# Patient Record
Sex: Female | Born: 1960 | Race: White | Hispanic: No | Marital: Married
Health system: Southern US, Community
[De-identification: ages and names within clinical notes are randomized; demographics above are authoritative.]

## PROBLEM LIST (undated history)

## (undated) DIAGNOSIS — E785 Hyperlipidemia, unspecified: Secondary | ICD-10-CM

## (undated) DIAGNOSIS — I10 Essential (primary) hypertension: Secondary | ICD-10-CM

## (undated) DIAGNOSIS — G4733 Obstructive sleep apnea (adult) (pediatric): Secondary | ICD-10-CM

## (undated) HISTORY — DX: Obstructive sleep apnea (adult) (pediatric): G47.33

## (undated) HISTORY — DX: Essential (primary) hypertension: I10

## (undated) HISTORY — DX: Hyperlipidemia, unspecified: E78.5

## (undated) HISTORY — PX: EXTERNAL EAR SURGERY: SHX627

---

## 2004-08-19 ENCOUNTER — Ambulatory Visit: Payer: Self-pay | Admitting: Internal Medicine

## 2004-12-26 ENCOUNTER — Ambulatory Visit: Payer: Self-pay | Admitting: Internal Medicine

## 2005-10-06 ENCOUNTER — Ambulatory Visit: Payer: Self-pay | Admitting: Internal Medicine

## 2006-10-14 ENCOUNTER — Ambulatory Visit: Payer: Self-pay | Admitting: Internal Medicine

## 2007-11-29 ENCOUNTER — Ambulatory Visit: Payer: Self-pay | Admitting: Internal Medicine

## 2008-06-26 ENCOUNTER — Emergency Department: Payer: Self-pay | Admitting: Emergency Medicine

## 2008-06-27 ENCOUNTER — Ambulatory Visit: Payer: Self-pay | Admitting: Internal Medicine

## 2008-10-03 ENCOUNTER — Ambulatory Visit: Payer: Self-pay | Admitting: Internal Medicine

## 2008-10-04 ENCOUNTER — Inpatient Hospital Stay: Payer: Self-pay | Admitting: Internal Medicine

## 2008-10-04 ENCOUNTER — Ambulatory Visit: Payer: Self-pay | Admitting: Internal Medicine

## 2008-10-10 ENCOUNTER — Ambulatory Visit: Payer: Self-pay | Admitting: Internal Medicine

## 2008-10-10 ENCOUNTER — Ambulatory Visit (HOSPITAL_COMMUNITY): Admission: RE | Admit: 2008-10-10 | Discharge: 2008-10-11 | Payer: Self-pay | Admitting: Internal Medicine

## 2008-10-11 DIAGNOSIS — R0989 Other specified symptoms and signs involving the circulatory and respiratory systems: Secondary | ICD-10-CM

## 2008-10-11 DIAGNOSIS — R0609 Other forms of dyspnea: Secondary | ICD-10-CM

## 2008-10-11 DIAGNOSIS — I498 Other specified cardiac arrhythmias: Secondary | ICD-10-CM

## 2008-10-11 DIAGNOSIS — I10 Essential (primary) hypertension: Secondary | ICD-10-CM

## 2008-10-12 ENCOUNTER — Telehealth: Payer: Self-pay | Admitting: Internal Medicine

## 2008-11-12 ENCOUNTER — Encounter: Payer: Self-pay | Admitting: Internal Medicine

## 2008-11-12 ENCOUNTER — Ambulatory Visit: Payer: Self-pay | Admitting: Internal Medicine

## 2008-11-15 ENCOUNTER — Telehealth: Payer: Self-pay | Admitting: Internal Medicine

## 2008-11-20 ENCOUNTER — Telehealth: Payer: Self-pay | Admitting: Internal Medicine

## 2008-12-03 ENCOUNTER — Ambulatory Visit: Payer: Self-pay | Admitting: Internal Medicine

## 2009-12-17 ENCOUNTER — Ambulatory Visit: Payer: Self-pay | Admitting: Internal Medicine

## 2010-09-16 NOTE — Discharge Summary (Signed)
NAME:  COLE, KLUGH                ACCOUNT NO.:  [id number]   MEDICAL RECORD NO.:  [id number]          PATIENT TYPE:  OIB   LOCATION:  2029                         FACILITY:  [facility]   PHYSICIAN:  Duke Salvia, MD, FACCDATE OF BIRTH:  06/01/1960   DATE OF ADMISSION:  10/10/2008  DATE OF DISCHARGE:  10/11/2008                               DISCHARGE SUMMARY   ADDENDUM:   MEDICATION CHANGES:  Added this admission magnesium oxide 400 mg daily  for a magnesium of 1.7.  Discontinue with triamterene,  hydrochlorothiazide 37.5/25.  She is to start hydrochlorothiazide 25 mg  daily; to start potassium chloride 10 mEq daily; to start Protonix 40 mg  daily; to start aspirin 325 mg daily for 6 weeks, and she can use her  other medications, which include calcium, Flonase, Zocor, Zyrtec,  trazodone, Zantac, colchicine.  Once again, followup visit with Dr.  Graciela Husbands on Monday, November 12, 2008 at 11:30.      Maple Mirza, [place]      Duke Salvia, MD, [facility]  Electronically Signed    GM/MEDQ  D:  10/11/2008  T:  10/12/2008  Job:  [id number]   cc:   Daniel Nones, MD  Duke Salvia, MD, [facility]  Dr. Juliann Pares

## 2010-09-16 NOTE — Discharge Summary (Signed)
NAME:  Kellie [name], Kellie [name]                ACCOUNT NO.:  [id number]   MEDICAL RECORD NO.:  [id number]          PATIENT TYPE:  INP   LOCATION:  2029                         FACILITY:  [facility]   PHYSICIAN:  Duke Salvia, MD, FACCDATE OF BIRTH:  Feb 10, 1961   DATE OF ADMISSION:  10/10/2008  DATE OF DISCHARGE:  10/11/2008                               DISCHARGE SUMMARY   ALLERGIES:  She has allergies to [facility] and NIACIN.   FINAL DIAGNOSES:  1. 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  2. Symptomatic supraventricular tachycardia.      a.     Symptoms of palpitation, dyspnea, and chest tightness.      b.     The patient has required adenosine to terminate both of her       previous episodes.   SECONDARY DIAGNOSES:  1. Echocardiogram on October 04, 2008, ejection fraction greater than 55%.  2. Previous Myoview study was negative for ischemia.   PROCEDURE:  On October 10, 2008, electrophysiology study with successful  ablation of atrioventricular node reentry tachycardia, slow pathway  modification, Dr. Graciela Husbands.  The patient maintaining sinus rhythm.   BRIEF HISTORY:  [name] is a 50 year old female.  She has had 2  episodes of heart racing both of them have had sudden onset, the first  February of this year, the second Wednesday, October 03, 2008, both have  required emergency room visits, both have required adenosine for  termination when she is in the throes of this dysrhythmia she has  palpitation, dyspnea, and chest tightness.  She was seen by Dr. Graciela Husbands at  her second admission at [facility].  Dr. Graciela Husbands  assessed her electrocardiogram and is admitting her today on October 10, 2008, for electrophysiology study.   HOSPITAL COURSE:  Elective admission on October 10, 2008.  She underwent  successful ablation of atrioventricular node reentry tachycardia  which  she involved a dual physiological pathway with slow pathway  modification.  The patient has done well on the postprocedure.  Discharging day 1, she is going home on the following medication,  1. Enteric-coated aspirin 325 mg daily for the next 6 weeks or until      November 22, 2008.  2. Calcium and vitamin D 600 mg twice daily.  3. Flonase 50 mcg per spray 2 sprays daily.  4. Zocor 20 mg daily at bedtime.  5. Zyrtec 10 mg daily at bedtime.  6. Trazodone 50 mg at night as needed.  7. Protonix 40 mg daily as needed.  8. Zantac 150 mg twice daily as needed.  9. Colchicine 0.6 mg every 8 hours as needed.   New medication, magnesium oxide 400 mg daily.  Her magnesium was 1.7 on  admission.   She follows up at [facility] at the [facility] office to see Dr.  Graciela Husbands, Monday, November 12, 2008, at 11:30.   LABORATORY STUDIES:  Drawn on June 3 white cells 9.5, hemoglobin 12.3,  hematocrit 35.2, and platelets 200.  Magnesium is 1.7, sodium 141,  potassium 4.1, chloride 108, carbonate 21, BUN 16, creatinine 1.16,  protime this admission 12.2, INR 0.9.      Maple Mirza, [place]      Duke Salvia, MD, [facility]  Electronically Signed    GM/MEDQ  D:  10/11/2008  T:  10/11/2008  Job:  [id number]   cc:   Dr. Rolland Porter

## 2010-09-16 NOTE — Letter (Signed)
November 12, 2008    Dr. Dorothyann Peng  [address]  [place], [place] [address]   RE:  Kellie [name], Kellie [name]  MRN:  [id number]  /  DOB:  [birth year]   Dear Gerda Diss:   Kellie [name] is seen in followup for her ablation for AV nodal reentry.  She  has had no clinical recurrences.  She is doing quite well.  She did have  postprocedural viral syndrome.   Her current medications include atenolol and hydrochlorothiazide, which  both serve for her blood pressure.  She has simvastatin, Flonase, and  colchicine p.r.n.   PHYSICAL EXAMINATION:  VITAL SIGNS:  Her blood pressure is mildly  elevated at 139/92.  Her pulse was 74.  LUNGS:  Clear.  HEART:  Sounds were regular.  EXTREMITIES:  Without edema.   Electrocardiogram was not obtained.   IMPRESSION:  1. Atrioventricular nodal reentry, status post ablation.  2. Hypertension.   Kellie [name] is doing well, post ablation.   Dwayne, thanks for allowing [place] to help with her care.   I did note that her blood pressure is still elevated and become less a  fan of beta-blockers over recent months as more the literature has  become available.  I will defer her long-term blood pressure management  with you.  If there is anything, I can do further, please do not  hesitate to contact me.    Sincerely,      Duke Salvia, MD, [facility]  Electronically Signed    SCK/MedQ  DD: 11/12/2008  DT: 11/13/2008  Job #: [id number]

## 2010-09-16 NOTE — Op Note (Signed)
NAME:  Kellie [name], Kellie [name]                ACCOUNT NO.:  [id number]   MEDICAL RECORD NO.:  [id number]          PATIENT TYPE:  INP   LOCATION:  2029                         FACILITY:  [facility]   PHYSICIAN:  Duke Salvia, MD, FACCDATE OF BIRTH:  August 11, 1960   DATE OF PROCEDURE:  10/10/2008  DATE OF DISCHARGE:                               OPERATIVE REPORT   PREOPERATIVE DIAGNOSIS:  Supraventricular tachycardia.   POSTOPERATIVE DIAGNOSIS:  Atrioventricular nodal reentry.   PROCEDURE:  Invasive electrophysiological study, arrhythmia mapping,  isoproterenol infusion, and radiofrequency catheter ablation.   Following obtaining informed consent, the patient was brought to the  electrophysiology laboratory and placed on the fluoroscopic table in  supine position.  After routine prep and drape, cardiac catheterization  was performed with local anesthesia and conscious sedation.  Noninvasive  blood pressure monitoring, transcutaneous oxygen saturation monitoring,  and end-tidal CO2 monitoring were performed continuously throughout the  procedure.  Following the procedure, the catheters were removed,  hemostasis was obtained, and the patient was transferred to the holding  area in stable condition.   CATHETERS:  A 5-French quadripolar catheter was inserted via the left  femoral vein to the AV junction.  A 5-French quadripolar catheter was inserted via the left femoral vein  to the right ventricular apex.  A 6-French octapolar catheter was inserted via the right femoral vein to  the coronary sinus.  A 7-French 4-mm deflectable catheter was inserted via the right femoral  vein using SL2 sheath to mapping sites in the posterior septal space.   Surface leads I, aVF, and V1 were monitored continuously throughout the  procedure.  Following insertion of the catheters, a stimulation protocol  included;  1. Incremental atrial pacing.  2. Incremental ventricular pacing.  3. Single and double atrial  extrastimuli paced cycle length of 700      msec in the presence and in the absence of isoproterenol.   END-TIDAL RESULTS:  End-tidal surface electrocardiogram at basic  intervals.  Initial:  Rhythm:  Sinus; RR interval:  703 msec; PR interval:  97 msec; QRS  duration:  85 msec; QT interval:  355 msec; bundle-branch block:  Absent; pre-excitation:  Absent.  AH interval:  56 msec; HV interval 35 msec.  Final:  Pre isoproterenol.  Rhythm:  Sinus; RR interval 777 msec; PR interval 129 msec; QRS  duration:  94 msec; QT interval:  361 msec; bundle-branch block:  Absent; pre-excitation:  Absent.  AH interval:  58 msec; HV interval:  47 msec.   AV nodal function:  AV Wenckebach was less than 300 msec, preprocedure  and postprocedure.  VA Wenckebach was less than 300 msec.  AV nodal conduction pre-ablation was discontinuous with echo beats and  inducible tachycardia.  Post-ablation was discontinuous with echo beats  in the presence and in the absence of isoproterenol without tachycardia.   Accessory pathway function:  No evidence of accessory pathway was  identified.   Ventricular response programmed stimulation:  Normal for ventricular  stimulation as described.   Arrhythmia was induced.   AV nodal reentry tachycardia was reproducibly inducible with both  ventricular pacing as well as coronary sinus pacing at 400: 250: 300 -  250.  Tachycardia cycle length typically was about 350 msec and during  the typical episode of tachycardia, the AH interval was 298 msec.  The  HA interval was 60 msec and the VA time was approximately 10 msec.   Radiofrequency energy, a total of 1 minute and 41 seconds of RF energy  was applied in 8 applications.  Two sites were presumed slow pathway and  potentially were identified.  Junctional rhythm ensued on the last with  resultant elimination of inducible tachycardia.   Because of the size and the shape of the [facility] triangle, elimination of  antegrade  slow pathway function was not pursued after tachycardia was  rendered noninducible in the presence and in the absence of  isoproterenol.   Fluoroscopy time, a total 6 minutes and 22 seconds of fluoroscopy time  was utilized at 7.5 frames per second.   IMPRESSION:  1. Normal sinus function.  2. Normal atrial function.  3. Abnormal atrioventricular nodal function manifested by inducible      slow - fast atrioventricular nodal reentry tachycardia.      Radiofrequency energy successfully rendered the tachycardia,      noninducible despite persistent slow pathway function.  4. Normal His-Purkinje system function.  5. No accessory pathway.  6. Normal ventricular response to programmed stimulation.   SUMMARY:  In conclusion, the results of electrophysiological testing  confirmed typical slow - fast AV nodal reentry as the mechanism  underlying Ms. Axford' clinical tachycardia.  Catheter ablation  successfully eliminated the tachycardia.   It was interesting to note that while she was on the table prior to  catheters being placed, she had a significant change in her RR interval  from 850 range to 600 range.  This gradually slowed down again.  No  significant P-wave morphology differences were noted.   The patient will be observed overnight and anticipate discharge in the  morning.      Duke Salvia, MD, [facility]  Electronically Signed     Duke Salvia, MD, [facility]  Electronically Signed    SCK/MEDQ  D:  10/10/2008  T:  10/11/2008  Job:  [id number]   cc:   Gerda Diss D. Juliann Pares, M.D.

## 2010-10-24 ENCOUNTER — Encounter: Payer: Self-pay | Admitting: Cardiovascular Disease

## 2010-10-28 ENCOUNTER — Encounter: Payer: Self-pay | Admitting: Internal Medicine

## 2010-12-30 ENCOUNTER — Ambulatory Visit: Payer: Self-pay | Admitting: Internal Medicine

## 2011-12-31 ENCOUNTER — Ambulatory Visit: Payer: Self-pay | Admitting: Internal Medicine

## 2012-01-20 ENCOUNTER — Inpatient Hospital Stay: Payer: Self-pay | Admitting: Surgery

## 2012-01-20 ENCOUNTER — Ambulatory Visit: Payer: Self-pay

## 2012-01-22 LAB — PATHOLOGY REPORT

## 2012-01-22 LAB — PLATELET COUNT: Platelet: 240 10*3/uL (ref 150–440)

## 2012-01-23 LAB — CBC WITH DIFFERENTIAL/PLATELET
Basophil %: 0.3 %
Eosinophil %: 0.9 %
HCT: 34.5 % — ABNORMAL LOW (ref 35.0–47.0)
HGB: 11.4 g/dL — ABNORMAL LOW (ref 12.0–16.0)
Lymphocyte #: 2.5 10*3/uL (ref 1.0–3.6)
Lymphocyte %: 20.2 %
MCHC: 33.1 g/dL (ref 32.0–36.0)
Monocyte %: 7.7 %
Neutrophil #: 8.7 10*3/uL — ABNORMAL HIGH (ref 1.4–6.5)
RBC: 3.84 10*6/uL (ref 3.80–5.20)

## 2012-01-24 LAB — CBC WITH DIFFERENTIAL/PLATELET
Basophil %: 0.4 %
Eosinophil %: 1.8 %
HGB: 11.1 g/dL — ABNORMAL LOW (ref 12.0–16.0)
MCH: 30.7 pg (ref 26.0–34.0)
Monocyte #: 0.7 x10 3/mm (ref 0.2–0.9)
Monocyte %: 8.3 %
Neutrophil %: 58.5 %
Platelet: 281 10*3/uL (ref 150–440)
RBC: 3.62 10*6/uL — ABNORMAL LOW (ref 3.80–5.20)

## 2013-01-10 ENCOUNTER — Ambulatory Visit: Payer: Self-pay | Admitting: Internal Medicine

## 2013-06-02 ENCOUNTER — Ambulatory Visit: Payer: Self-pay | Admitting: Unknown Physician Specialty

## 2014-01-17 ENCOUNTER — Ambulatory Visit: Payer: Self-pay | Admitting: Internal Medicine

## 2014-08-21 NOTE — Op Note (Signed)
PATIENT NAME:  Kellie [name], Kellie [name] MR#:  [id number] DATE OF BIRTH:  1960/10/22  DATE OF PROCEDURE:  01/21/2012  PREOPERATIVE DIAGNOSIS: Acute appendicitis.  POSTOPERATIVE DIAGNOSIS: Acute appendicitis with appendiceal mass.  PROCEDURE PERFORMED: Diagnostic laparoscopy and open right colectomy.  SURGEON: Renda RollsWilton Sophie Tamez, MD  ANESTHESIA: General.   INDICATIONS: This 54 year old female came in with abdominal pain, right lower quadrant tenderness, leukocytosis, and CT findings of appendicitis and surgery was recommended for definitive treatment.   DESCRIPTION OF PROCEDURE: The patient was placed on the operating table in the supine position under general endotracheal anesthesia. The abdomen was prepared with ChloraPrep and draped in [name] sterile manner.   [name] short incision was made in the inferior aspect of the umbilicus and carried down to the deep fascia which was grasped with laryngeal hook and elevated. [name] Veress needle was inserted, aspirated, and irrigated with [name] saline solution. Next, the peritoneal cavity was inflated with carbon dioxide. The Veress needle was removed. The 10 mm cannula was inserted. The 10 mm, 0 degree laparoscope was inserted to view the peritoneal cavity. There was [name] trace of serosanguineous fluid seen in the right lower quadrant. Another incision was made in the left lower quadrant lateral to the inferior epigastric vessels to insert [name] 12 mm cannula. Another incision was made in the right lower quadrant lateral to the inferior epigastric vessels to insert [name] 5 mm cannula. The patient was tilted [name] few degrees to the left, in Trendelenburg position. The cecum was grasped with Babcock clamp and retracted. There was evidence of inflammation near the cecum. The terminal ileum was identified and appeared normal. It appeared that there was firmness in the wall of the cecum and the cecum was mobilized with incision of the lateral peritoneal reflection using microdissector, scissors, and cautery,  and with further dissection it appeared that there was an appendiceal mass. It did not appear that the appendix could be separated from the cecum and made [name] decision to convert to an open procedure. Therefore, the laparoscopic instruments were removed. There was no significant bleeding from the port sites. The lower quadrant incisions were closed with 4-0 nylon. [name] laparotomy incision was made in the midline, both above and below the umbilicus, and carried down through subcutaneous tissues. The midline fascia was incised. Numerous small bleeding points were cauterized and with retraction the cecum was identified. There was [name] hard mass involving the cecum and the appendix and at this point it was not identifiable other than the mass and the mass appeared to be inseparable from the cecum. The right colon was mobilized further with incision of the lateral peritoneal reflection. I also mobilized the hepatic flexure and separated [name] portion of the omentum from the cecum using the Harmonic scalpel and it appeared at this point there may be cancer involved in the appendix and elected to do [name] right colectomy for [name] definitive procedure. The terminal ileum was again examined; it appeared normal. [name] window was created in the mesentery of the terminal ileum just about 4 cm proximal to the ileocecal valve and mesenteric dissection begun with Harmonic scalpel. I also selected [name] site in the transverse colon just to the right of the middle colic vessels and made [name] window in the mesentery and began with the Harmonic scalpel to dissect the mesentery there. The ileocolic artery and vein were doubly suture ligated with 0 chromic and divided with the Harmonic scalpel and [name] V-shaped segment of mesentery was dissected out and included in the resection.  The small bowel was placed beside the transverse colon. An enterotomy was made and also [name] colotomy was made in the tenia coli. The anastomosis was begun with application of the GIA 75 stapler  along the antimesenteric border. The staple line appeared to be hemostatic. The anastomosis was completed with application of [name] TA-60 stapler which was placed perpendicular to the first, engaged, activated, and the specimen excised and passed off to [name] side table. Several small bleeding points along the staple line were cauterized. Hemostasis subsequently appeared to be intact. The anastomosis looked good and was patent. The mesenteric defect was closed with running 3-0 chromic. Pull suction was used to aspirate. There was minimal amount of serosanguineous fluid in the right colic gutter. The omentum was brought beneath the wound. The midline fascia was closed with interrupted 0 Maxon figure-of-eight sutures. The periumbilical skin was closed with interrupted 4-0 nylon vertical mattress sutures. The remainder of the incision was closed with clips. Dressings were applied for all incisions with paper tape. The patient tolerated surgery satisfactorily and was then prepared for transfer to the recovery room.  ____________________________ Kellie [name]. Renda Rolls, MD jws:slb D: 01/21/2012 02:11:16 ET     T: 01/21/2012 13:10:13 ET       JOB#: [id number] cc: [name], MD, <Dictator> [name] MD ELECTRONICALLY SIGNED 01/21/2012 18:23

## 2014-08-21 NOTE — H&P (Signed)
PATIENT NAME:  Kellie Kellie [name], Kellie Kellie [name] MR#:  [id number] DATE OF BIRTH:  05-27-1960  DATE OF ADMISSION:  01/20/2012  REFERRING PHYSICIAN:   [name], PA-C  HISTORY OF PRESENT ILLNESS: This 54 year old female came into the office referred by Kellie Kellie [name]. She is regularly Kellie [name] patient of Dr. Doristine ChurchBert [name]'s. She has Kellie [name] chief complaint of abdominal pain. She reports that last week she had some mild nausea, and then three days ago she had some fairly severe epigastric pain which would come, seemed to move down to the right lower abdomen. She has been anorexic, still slightly nauseated and has not vomited. She had some minimal diarrhea. No rectal bleeding. She has had low-grade fever of 100, no chills. The pain is slightly aggravated by walking and by taking Kellie [name] deep breath. She saw Kellie Kellie [name] in the office earlier today and had Kellie [name] CT scan, which I have reviewed the images which are indicative of appendicitis with Kellie [name] dilated appendiceal lumen and some surrounding fat stranding.   PAST MEDICAL HISTORY:  1. Hypertension.  2. Hyperlipidemia.  3. Occasional headaches.  4. History of allergic rhinitis.  5. History of gout.  6. 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 No further symptoms and no further history of heart disease.  7. Has had hyperglycemia.  8. She reports no history of lung disease.  9. No history of hepatitis.   PAST SURGICAL HISTORY:  1. Repair of cleft palate during infancy.  2. Nasal surgery.  3. Multiple ear operations.  4. Two C-sections.  5. The above-mentioned ablation therapy for supraventricular tachycardia.   SOCIAL HISTORY: She does not smoke. She does not drink any alcohol. She is accompanied by her husband.  FAMILY HISTORY: She does not remember any specific familial illness.   MEDICATIONS:  1. Calcium 600 Plus D twice Kellie [name] day.  2. Colchicine 0.6 mg p.r.n. for gout.  3. Flonase 2 sprays into both nostrils once Kellie [name] day.   4. Hydrochlorothiazide 25 mg daily.  5. Trazodone 50 mg at bedtime. 6. Zyrtec 10 mg daily. 7. Kellie [name] vitamin daily. 8. Potassium chloride 10 mEq daily.  9. Simvastatin 40 mg at bedtime.  10. Omeprazole 20 mg daily.  11. Trivora daily.   ALLERGIES: Levaquin and shellfish.   REVIEW OF SYSTEMS: She has had some recent weight gain, some sinus congestion. She does have heavy menstrual periods. She has had GYN consultation. Her last menstrual period was 12/31/2011. She has some occasional sinus headaches. She reports she is breathing satisfactorily. She has had some slight diarrhea. She is voiding satisfactorily. She has had no ankle edema. No recent sores or boils. Review of systems is otherwise negative.   CLINICAL DATA: Lab work done earlier today: White blood count was 18,000, hemoglobin 14.2, platelet count 262,000. Comprehensive metabolic panel was with Kellie [name] creatinine of 1.0, glucose 146, potassium 3.3, sodium 135. Urinalysis was cloudy, rare bacteria.   IMPRESSION: Acute appendicitis.   RECOMMENDATIONS: I recommend admission to the hospital and laparoscopic appendectomy. I discussed the operation, care, risks, and benefits with her and will get this set up. I also escorted her over to the hospital and all arrangements were made, and  I called the OR to get it scheduled.  ____________________________ Kellie Kellie [name]. Kellie RollsWilton Ahijah Devery, MD jws:cbb D: 01/20/2012 17:55:57 ET T: 01/20/2012 18:53:16 ET JOB#: [id number] Kellie HareWILTON [name] Kellie Thayne MD ELECTRONICALLY SIGNED 01/21/2012 18:23

## 2014-08-21 NOTE — Discharge Summary (Signed)
PATIENT NAME:  Kellie [name], Kellie [name] MR#:  [id number] DATE OF BIRTH:  11/10/1960  DATE OF ADMISSION:  01/20/2012 DATE OF DISCHARGE:  01/26/2012  HISTORY OF PRESENT ILLNESS: This [age] year old female came in emergently with [name] chief complaint of abdominal pain. She had had some recent nausea and then developed epigastric pain, which over [name] period of days appeared to migrate into the right lower quadrant. She had [name] low-grade fever.   PAST MEDICAL HISTORY: As recorded on the typed History and Physical.   MEDICATIONS: Recorded.   PHYSICAL EXAMINATION: GENERAL: She was awake, alert, and oriented. LUNGS: Lungs sounds were clear. HEART: Regular rhythm, S1 and S2. Sclerae clear. Pharynx clear. ABDOMEN: Localized right lower quadrant tenderness with guarding.   She had CT findings consistent with appendicitis.   CLINICAL DATA: Her white blood count was 18,000, hemoglobin 14.2, comprehensive metabolic panel was with elevated glucose at 146 and potassium slightly low at 3.3.  She was admitted emergently to the hospital and carried to the operating room for laparoscopic appendectomy. She did have [name] preop dose of intravenous Invanz and at the time of surgery she was found to have [name] large inflammatory mass of the appendix. However, at the time of surgery it was unclear whether this was inflammatory mass or neoplasm, and the appendix could not be separated from the cecum. Therefore, the procedure was converted to an open procedure and [name] right colectomy was done including the appendix and several inches of terminal ileum with primary anastomosis.  Postoperatively she was treated with analgesics, subcutaneous heparin, and gradually increased her activities. She was started on [name] clear liquid diet and later full liquids and later advanced to solid food.  She did have bowel movement prior to discharge.  Final pathology demonstrated acute appendicitis. There was no neoplasm found.   FINAL DIAGNOSES:  1. Acute  appendicitis. 2. Hypokalemia.   OPERATION: Laparoscopy with open right colectomy.   DISCHARGE INSTRUCTIONS: Discharge instructions were given and plans were made for followup in the office.    ____________________________ J. Renda RollsWilton Smith, MD jws:bjt D: 02/10/2012 10:22:51 ET T: 02/10/2012 12:31:24 ET JOB#: [id number]  cc: Adella HareJ. Wilton Smith, MD, <Dictator> Adella HareWILTON J SMITH MD ELECTRONICALLY SIGNED 02/11/2012 12:35

## 2015-01-22 ENCOUNTER — Other Ambulatory Visit: Payer: Self-pay | Admitting: Obstetrics and Gynecology

## 2015-01-22 DIAGNOSIS — Z1231 Encounter for screening mammogram for malignant neoplasm of breast: Secondary | ICD-10-CM

## 2015-01-23 ENCOUNTER — Ambulatory Visit
Admission: RE | Admit: 2015-01-23 | Discharge: 2015-01-23 | Disposition: A | Discharge status: Home / Self Care | Payer: BLUE CROSS/BLUE SHIELD | Source: Ambulatory Visit | Attending: Obstetrics and Gynecology | Admitting: Obstetrics and Gynecology

## 2015-01-23 DIAGNOSIS — Z1231 Encounter for screening mammogram for malignant neoplasm of breast: Secondary | ICD-10-CM | POA: Insufficient documentation

## 2016-01-14 ENCOUNTER — Other Ambulatory Visit: Payer: Self-pay | Admitting: Obstetrics and Gynecology

## 2016-01-14 DIAGNOSIS — Z1231 Encounter for screening mammogram for malignant neoplasm of breast: Secondary | ICD-10-CM

## 2016-02-03 ENCOUNTER — Ambulatory Visit
Admission: RE | Admit: 2016-02-03 | Discharge: 2016-02-03 | Disposition: A | Discharge status: Home / Self Care | Payer: BLUE CROSS/BLUE SHIELD | Source: Ambulatory Visit | Attending: Obstetrics and Gynecology | Admitting: Obstetrics and Gynecology

## 2016-02-03 ENCOUNTER — Encounter: Payer: Self-pay | Admitting: Radiology

## 2016-02-03 DIAGNOSIS — Z1231 Encounter for screening mammogram for malignant neoplasm of breast: Secondary | ICD-10-CM | POA: Diagnosis present

## 2017-05-19 ENCOUNTER — Other Ambulatory Visit: Payer: Self-pay | Admitting: Internal Medicine

## 2017-05-19 DIAGNOSIS — Z1231 Encounter for screening mammogram for malignant neoplasm of breast: Secondary | ICD-10-CM

## 2017-05-20 ENCOUNTER — Encounter: Payer: Self-pay | Admitting: Radiology

## 2017-05-20 ENCOUNTER — Ambulatory Visit
Admission: RE | Admit: 2017-05-20 | Discharge: 2017-05-20 | Disposition: A | Discharge status: Home / Self Care | Payer: BLUE CROSS/BLUE SHIELD | Source: Ambulatory Visit | Attending: Internal Medicine | Admitting: Internal Medicine

## 2017-05-20 DIAGNOSIS — Z1231 Encounter for screening mammogram for malignant neoplasm of breast: Secondary | ICD-10-CM | POA: Diagnosis not present

## 2018-07-13 ENCOUNTER — Other Ambulatory Visit: Payer: Self-pay | Admitting: Internal Medicine

## 2018-07-13 DIAGNOSIS — Z1231 Encounter for screening mammogram for malignant neoplasm of breast: Secondary | ICD-10-CM

## 2018-10-07 ENCOUNTER — Other Ambulatory Visit: Payer: Self-pay

## 2018-10-07 ENCOUNTER — Ambulatory Visit
Admission: RE | Admit: 2018-10-07 | Discharge: 2018-10-07 | Disposition: A | Discharge status: Home / Self Care | Payer: 59 | Source: Ambulatory Visit | Attending: Internal Medicine | Admitting: Internal Medicine

## 2018-10-07 DIAGNOSIS — Z1231 Encounter for screening mammogram for malignant neoplasm of breast: Secondary | ICD-10-CM | POA: Insufficient documentation

## 2019-06-29 ENCOUNTER — Ambulatory Visit: Payer: 59 | Attending: Internal Medicine

## 2019-06-29 DIAGNOSIS — Z23 Encounter for immunization: Secondary | ICD-10-CM | POA: Insufficient documentation

## 2019-06-29 NOTE — Progress Notes (Signed)
   Covid-19 Vaccination Clinic  Name:  ACHAIA GARLOCK    MRN: [id number] DOB: Feb 11, 1961  06/29/2019  Ms. Planck was observed post Covid-19 immunization for 15 minutes without incidence. She was provided with Vaccine Information Sheet and instruction to access the V-Safe system.   Ms. Sleep was instructed to call 911 with any severe reactions post vaccine: Marland Kitchen Difficulty breathing  . Swelling of your face and throat  . A fast heartbeat  . A bad rash all over your body  . Dizziness and weakness    Immunizations Administered    Name Date Dose VIS Date Route   Pfizer COVID-19 Vaccine 06/29/2019 12:02 PM 0.3 mL 04/14/2019 Intramuscular   Manufacturer: [employer], [employer]   Lot: [id number]   NDC: [address]-2

## 2019-07-25 ENCOUNTER — Ambulatory Visit: Payer: 59 | Attending: Internal Medicine

## 2019-07-25 DIAGNOSIS — Z23 Encounter for immunization: Secondary | ICD-10-CM

## 2019-07-25 NOTE — Progress Notes (Signed)
   Covid-19 Vaccination Clinic  Name:  LAELA DEVINEY    MRN: [id number] DOB: Oct 16, 1960  07/25/2019  Ms. Turek was observed post Covid-19 immunization for 15 minutes without incident. She was provided with Vaccine Information Sheet and instruction to access the V-Safe system.   Ms. Mulvaney was instructed to call 911 with any severe reactions post vaccine: Marland Kitchen Difficulty breathing  . Swelling of face and throat  . A fast heartbeat  . A bad rash all over body  . Dizziness and weakness   Immunizations Administered    Name Date Dose VIS Date Route   Pfizer COVID-19 Vaccine 07/25/2019 12:39 PM 0.3 mL 04/14/2019 Intramuscular   Manufacturer: [employer], [employer]   Lot: [id number]   NDC: [address]-0

## 2020-04-03 ENCOUNTER — Other Ambulatory Visit: Payer: Self-pay | Admitting: Obstetrics and Gynecology

## 2020-04-03 DIAGNOSIS — Z1231 Encounter for screening mammogram for malignant neoplasm of breast: Secondary | ICD-10-CM

## 2020-05-15 ENCOUNTER — Other Ambulatory Visit: Payer: Self-pay

## 2020-05-15 ENCOUNTER — Ambulatory Visit
Admission: RE | Admit: 2020-05-15 | Discharge: 2020-05-15 | Disposition: A | Discharge status: Home / Self Care | Payer: 59 | Source: Ambulatory Visit | Attending: Obstetrics and Gynecology | Admitting: Obstetrics and Gynecology

## 2020-05-15 DIAGNOSIS — Z1231 Encounter for screening mammogram for malignant neoplasm of breast: Secondary | ICD-10-CM | POA: Insufficient documentation

## 2020-05-23 ENCOUNTER — Other Ambulatory Visit: Payer: Self-pay | Admitting: Obstetrics and Gynecology

## 2020-05-23 DIAGNOSIS — N6489 Other specified disorders of breast: Secondary | ICD-10-CM

## 2020-05-23 DIAGNOSIS — R928 Other abnormal and inconclusive findings on diagnostic imaging of breast: Secondary | ICD-10-CM

## 2020-05-30 ENCOUNTER — Other Ambulatory Visit: Payer: Self-pay

## 2020-05-30 ENCOUNTER — Ambulatory Visit
Admission: RE | Admit: 2020-05-30 | Discharge: 2020-05-30 | Disposition: A | Discharge status: Home / Self Care | Payer: 59 | Source: Ambulatory Visit | Attending: Obstetrics and Gynecology | Admitting: Obstetrics and Gynecology

## 2020-05-30 DIAGNOSIS — N6489 Other specified disorders of breast: Secondary | ICD-10-CM | POA: Insufficient documentation

## 2020-05-30 DIAGNOSIS — R928 Other abnormal and inconclusive findings on diagnostic imaging of breast: Secondary | ICD-10-CM | POA: Diagnosis present

## 2020-06-03 ENCOUNTER — Other Ambulatory Visit: Payer: Self-pay | Admitting: Obstetrics and Gynecology

## 2020-06-03 DIAGNOSIS — R928 Other abnormal and inconclusive findings on diagnostic imaging of breast: Secondary | ICD-10-CM

## 2020-06-03 DIAGNOSIS — N6489 Other specified disorders of breast: Secondary | ICD-10-CM

## 2020-06-04 ENCOUNTER — Other Ambulatory Visit: Payer: Self-pay

## 2020-06-04 ENCOUNTER — Ambulatory Visit
Admission: RE | Admit: 2020-06-04 | Discharge: 2020-06-04 | Disposition: A | Discharge status: Home / Self Care | Payer: 59 | Source: Ambulatory Visit | Attending: Obstetrics and Gynecology | Admitting: Obstetrics and Gynecology

## 2020-06-04 DIAGNOSIS — R928 Other abnormal and inconclusive findings on diagnostic imaging of breast: Secondary | ICD-10-CM | POA: Insufficient documentation

## 2020-06-04 DIAGNOSIS — N6489 Other specified disorders of breast: Secondary | ICD-10-CM | POA: Insufficient documentation

## 2020-06-04 HISTORY — PX: BREAST BIOPSY: SHX20

## 2020-06-05 LAB — SURGICAL PATHOLOGY

## 2021-11-16 IMAGING — MG MM BREAST BX W LOC DEV 1ST LESION IMAGE BX SPEC STEREO GUIDE*L*
8 of 14 series · 8 of 26 positions shown · non-contrast
Comparison: Previous exams.
COMPARISON: Previous exams.

Addendum:
CLINICAL DATA: 59-year-old female for tissue sampling of OUTER LEFT
breast asymmetry

EXAM:
LEFT BREAST STEREOTACTIC CORE NEEDLE BIOPSY

[L (1 of 8)]
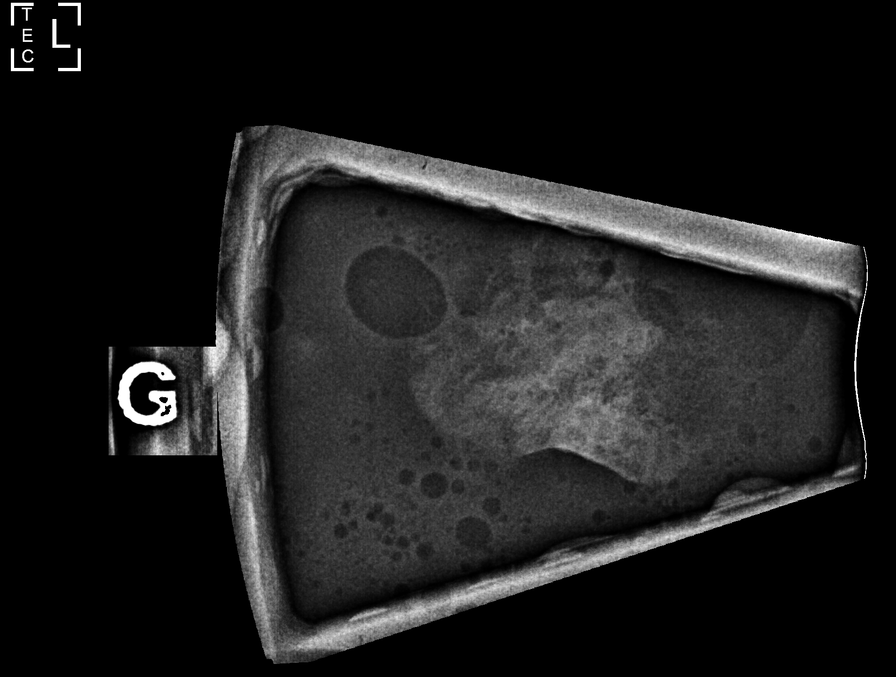

[L (2 of 8)]
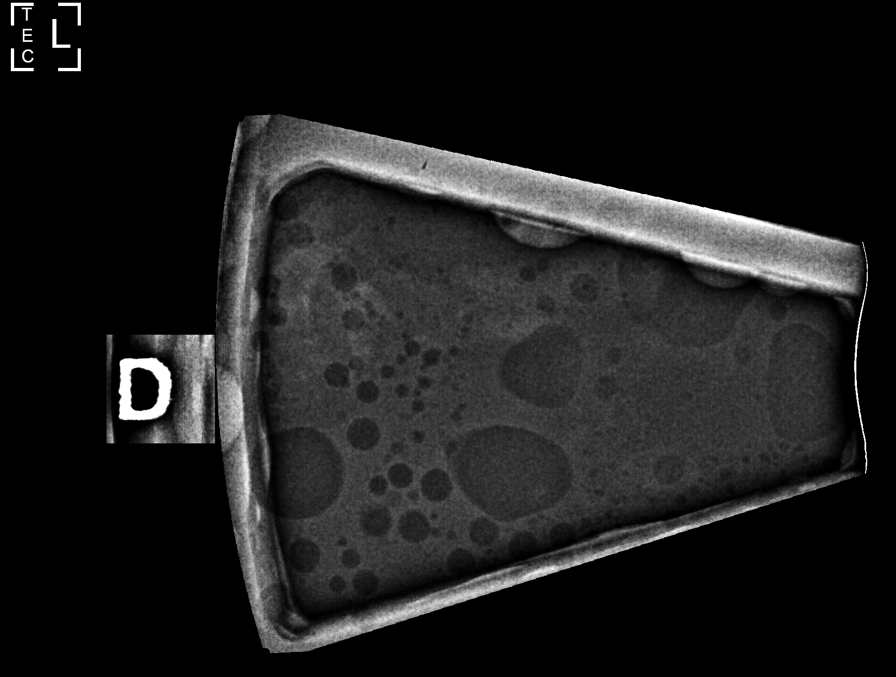

[L (3 of 8)]
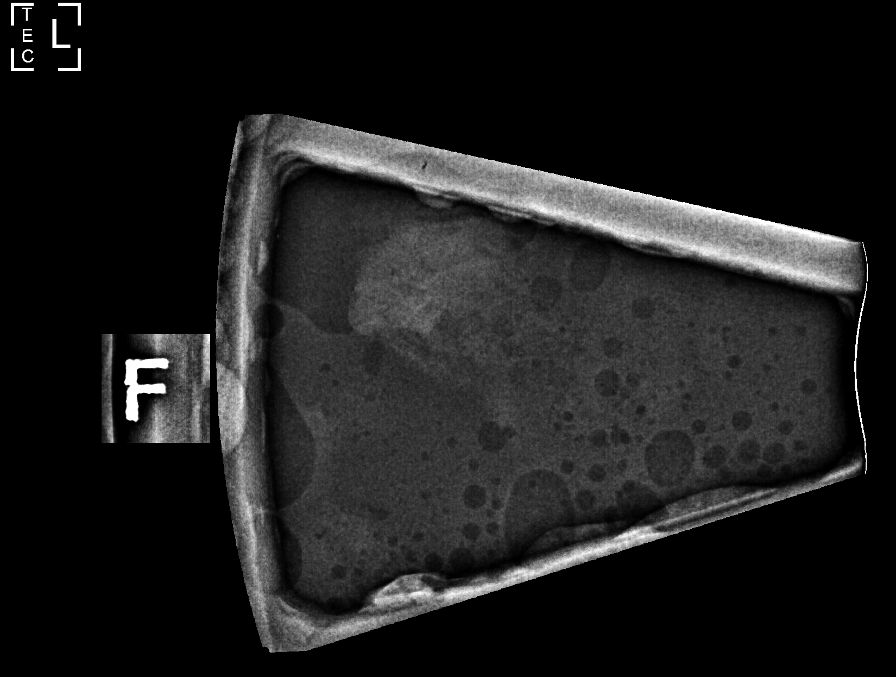

[L (4 of 8)]
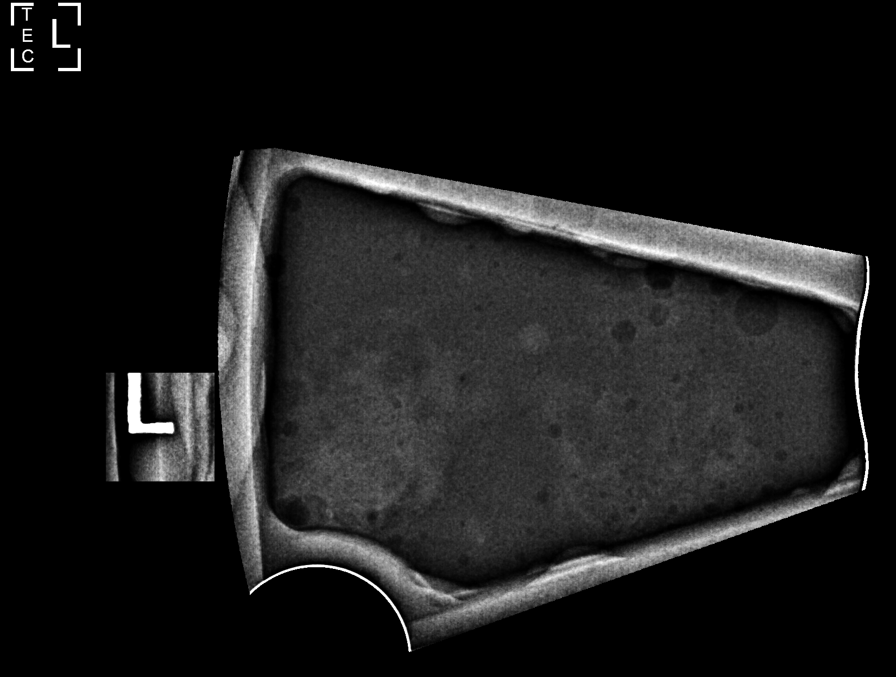

[L (5 of 8)]
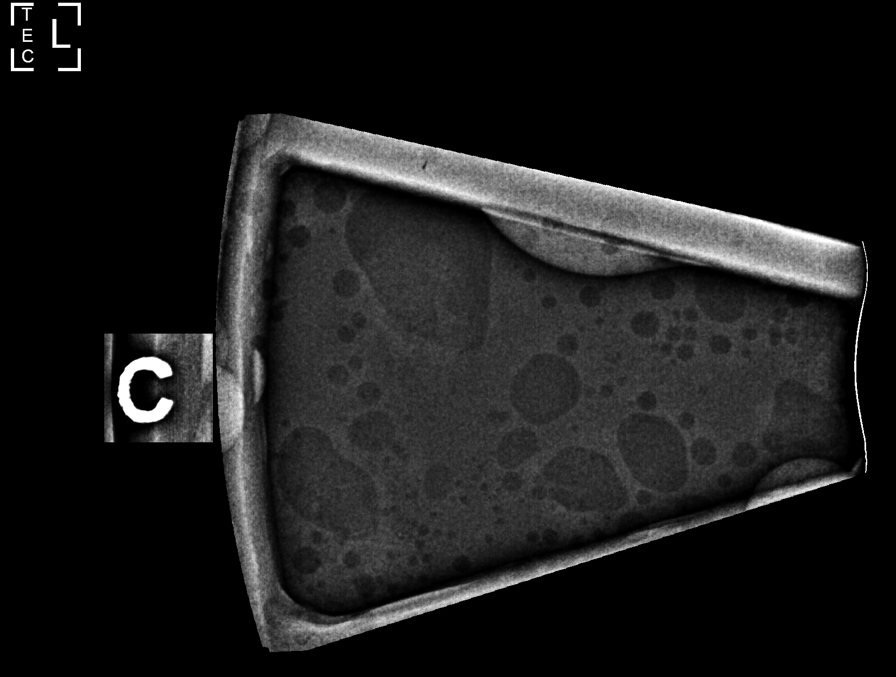

[L (6 of 8)]
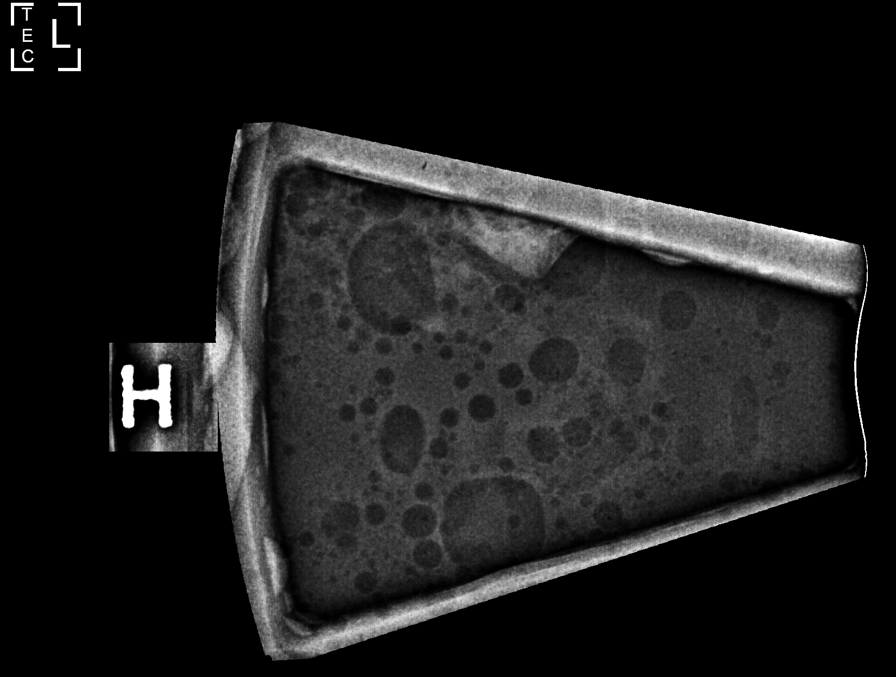

[L (7 of 8)]
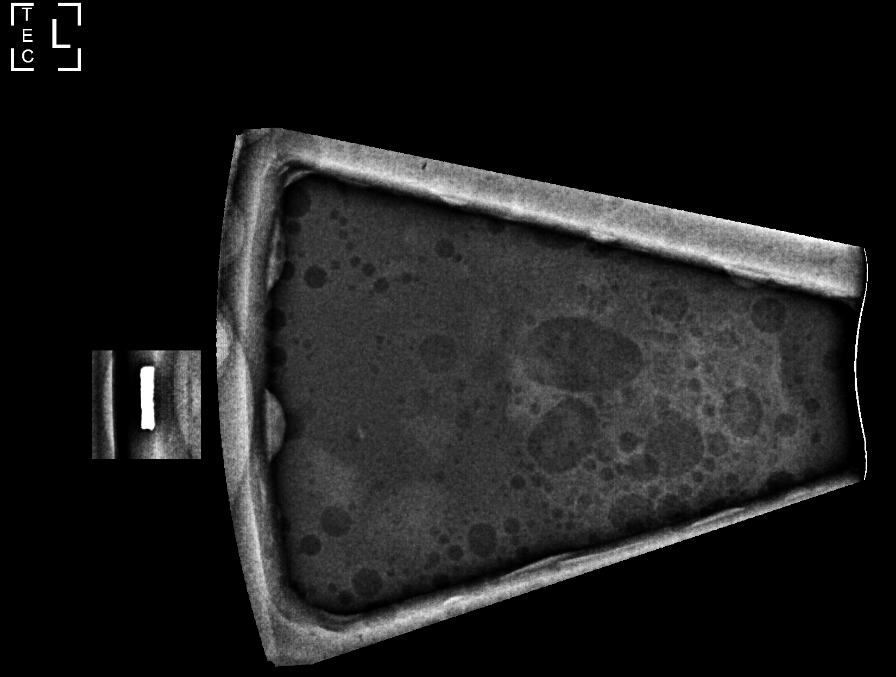

[L (8 of 8)]
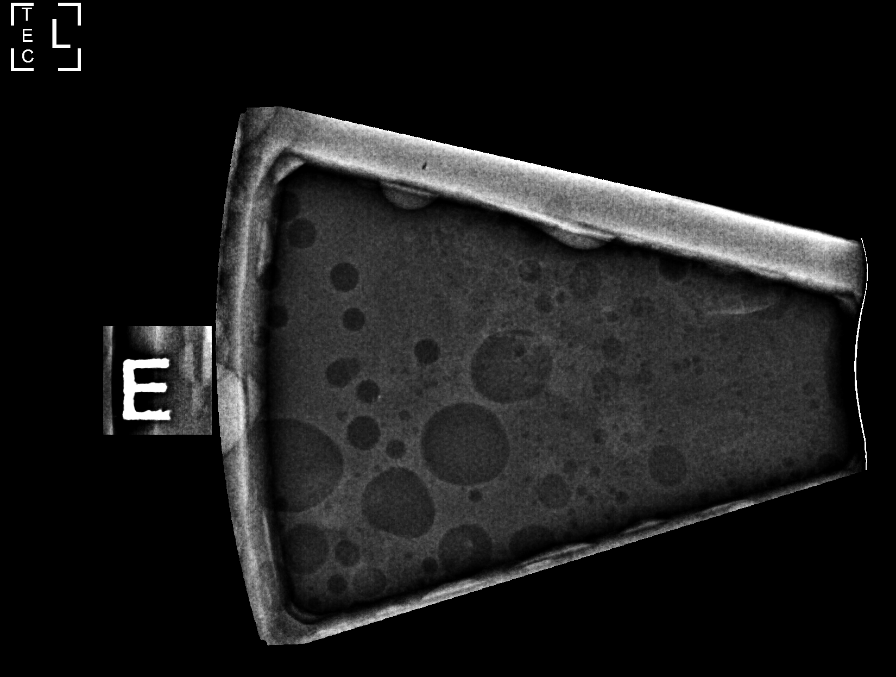

[8 of 26 positions shown; findings below may reference images not displayed]



Using sterile technique and 1% Lidocaine as local anesthetic, under
stereotactic guidance, a 9 gauge vacuum assisted device was used to
perform core needle biopsy of asymmetry within the OUTER LEFT breast
using a SUPERIOR approach. Specimen radiograph was performed.

At the conclusion of the procedure, an X tissue marker clip was
deployed into the biopsy cavity. Follow-up 2-view mammogram was
performed and dictated separately.
IMPRESSION: Stereotactic-guided biopsy of OUTER LEFT breast asymmetry. No
apparent complications.

ADDENDUM:
PATHOLOGY revealed: A. LEFT BREAST, OUTER; STEREOTACTIC BIOPSY: -
BENIGN BREAST TISSUE WITH FIBROCYSTIC CHANGES AND PSEUDOANGIOMATOUS
STROMAL HYPERPLASIA (PASH). - CALCIFICATIONS ARE NOTED IN
ASSOCIATION WITH BENIGN BREAST TISSUE. - NEGATIVE FOR ATYPIA AND
MALIGNANCY.

Pathology results are CONCORDANT with imaging findings, per Dr. Gollo
Brion.

Pathology results and recommendations below were discussed with
patient by telephone on 06/05/2020. Patient reported biopsy site
within normal limits with slight tenderness at the site. Post biopsy
care instructions were reviewed, questions were answered and my
direct phone number was provided to patient. Patient was instructed
to call [HOSPITAL] if any concerns or questions arise
related to the biopsy.

Recommendation: Resume annual bilateral screening mammogram due
May 2021.

Pathology results reported by Riffat Branch RN on 06/05/2020.



Using sterile technique and 1% Lidocaine as local anesthetic, under
stereotactic guidance, a 9 gauge vacuum assisted device was used to
perform core needle biopsy of asymmetry within the OUTER LEFT breast
using a SUPERIOR approach. Specimen radiograph was performed.

At the conclusion of the procedure, an X tissue marker clip was
deployed into the biopsy cavity. Follow-up 2-view mammogram was
performed and dictated separately.
IMPRESSION: Stereotactic-guided biopsy of OUTER LEFT breast asymmetry. No
apparent complications.

## 2021-11-16 IMAGING — MG MM BREAST LOCALIZATION CLIP
4 series · 4 of 12 positions shown · non-contrast
Comparison: Previous exam(s).

CLINICAL DATA: Evaluate X biopsy clip placement following
stereotactic guided LEFT breast biopsy.

EXAM:
3D DIAGNOSTIC LEFT MAMMOGRAM POST STEREOTACTIC BIOPSY

[L ML synth-2D]
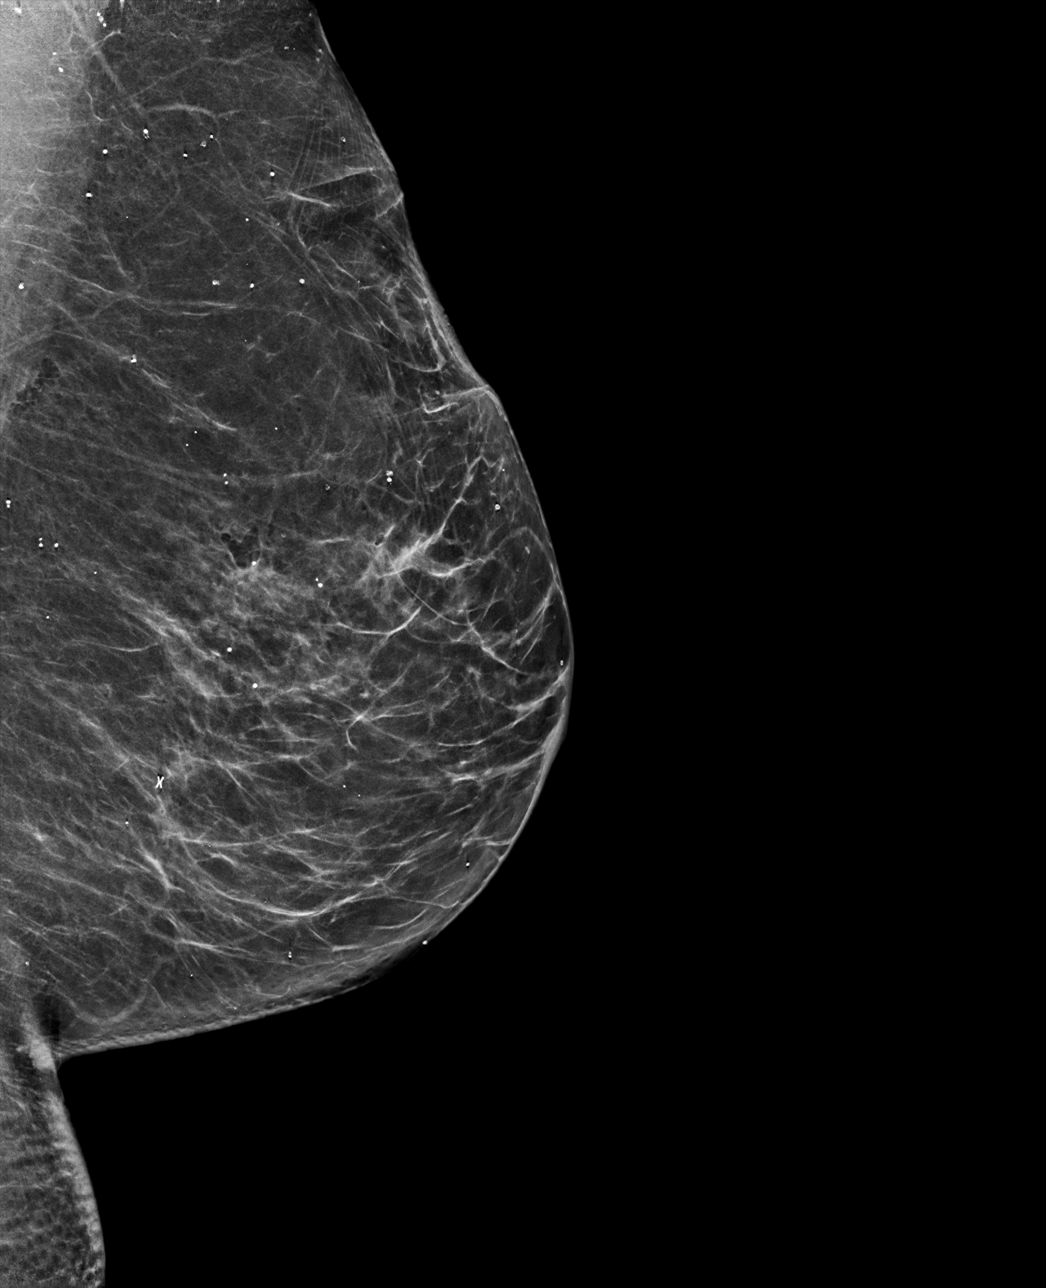

[L CC synth-2D]
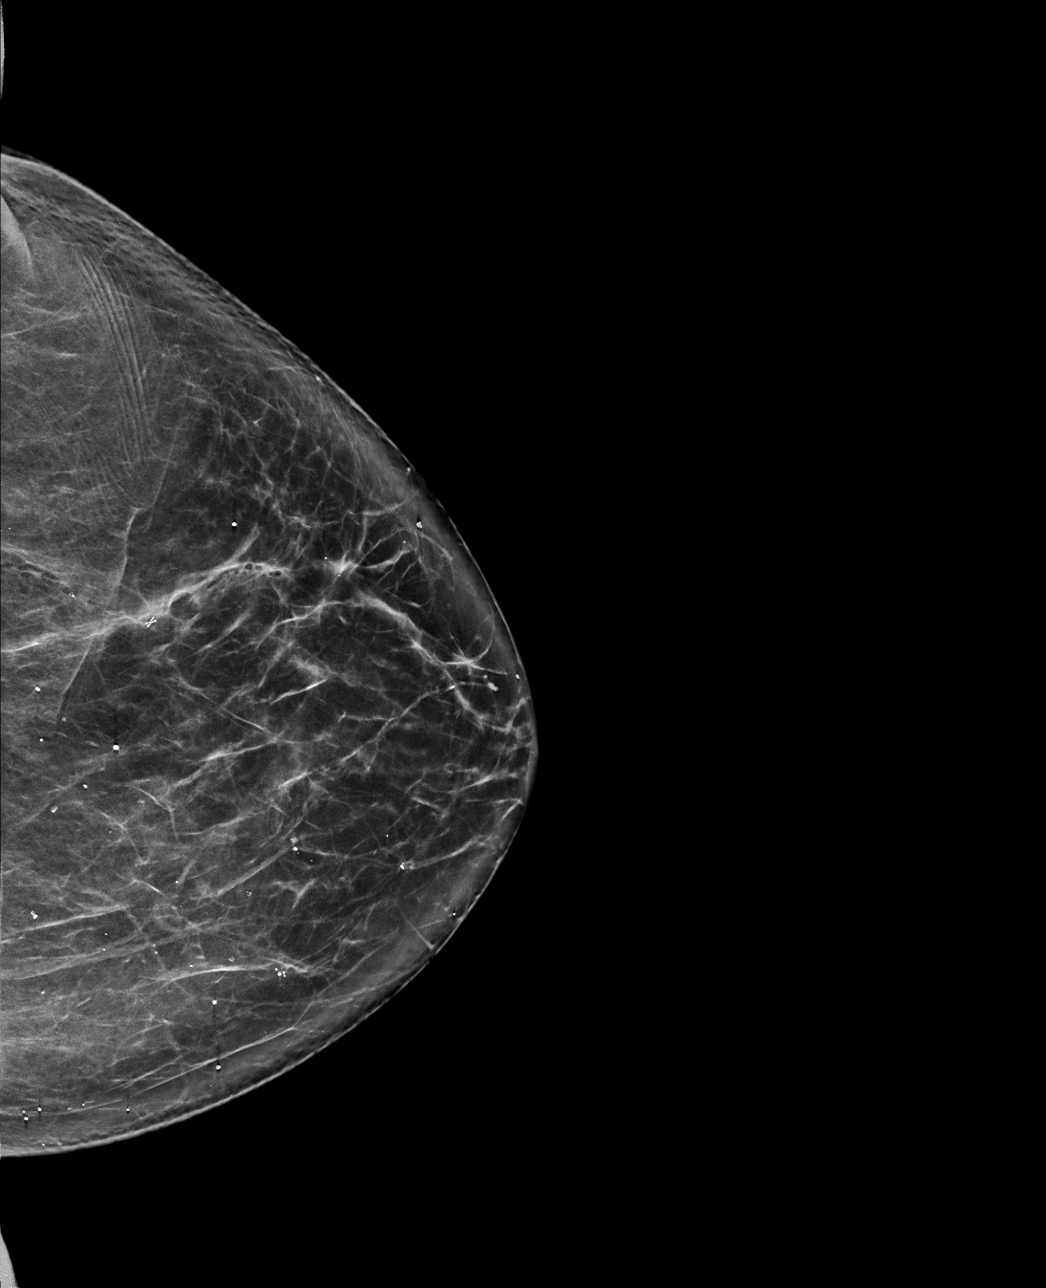

[L ML tomo · tomo slice 36/71.0]
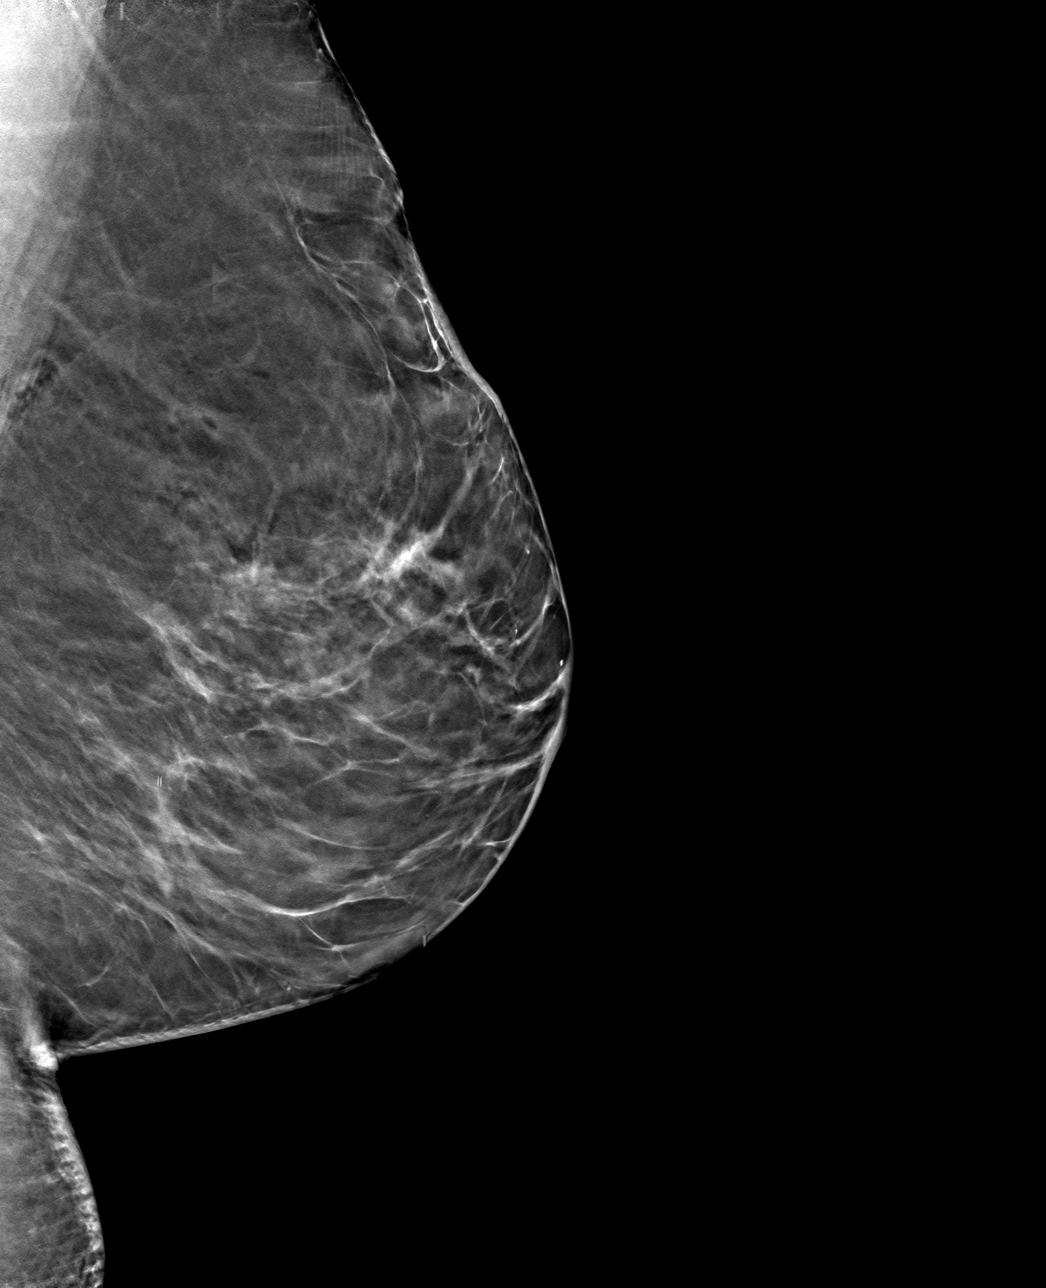

[L CC tomo · tomo slice 39/76.0]
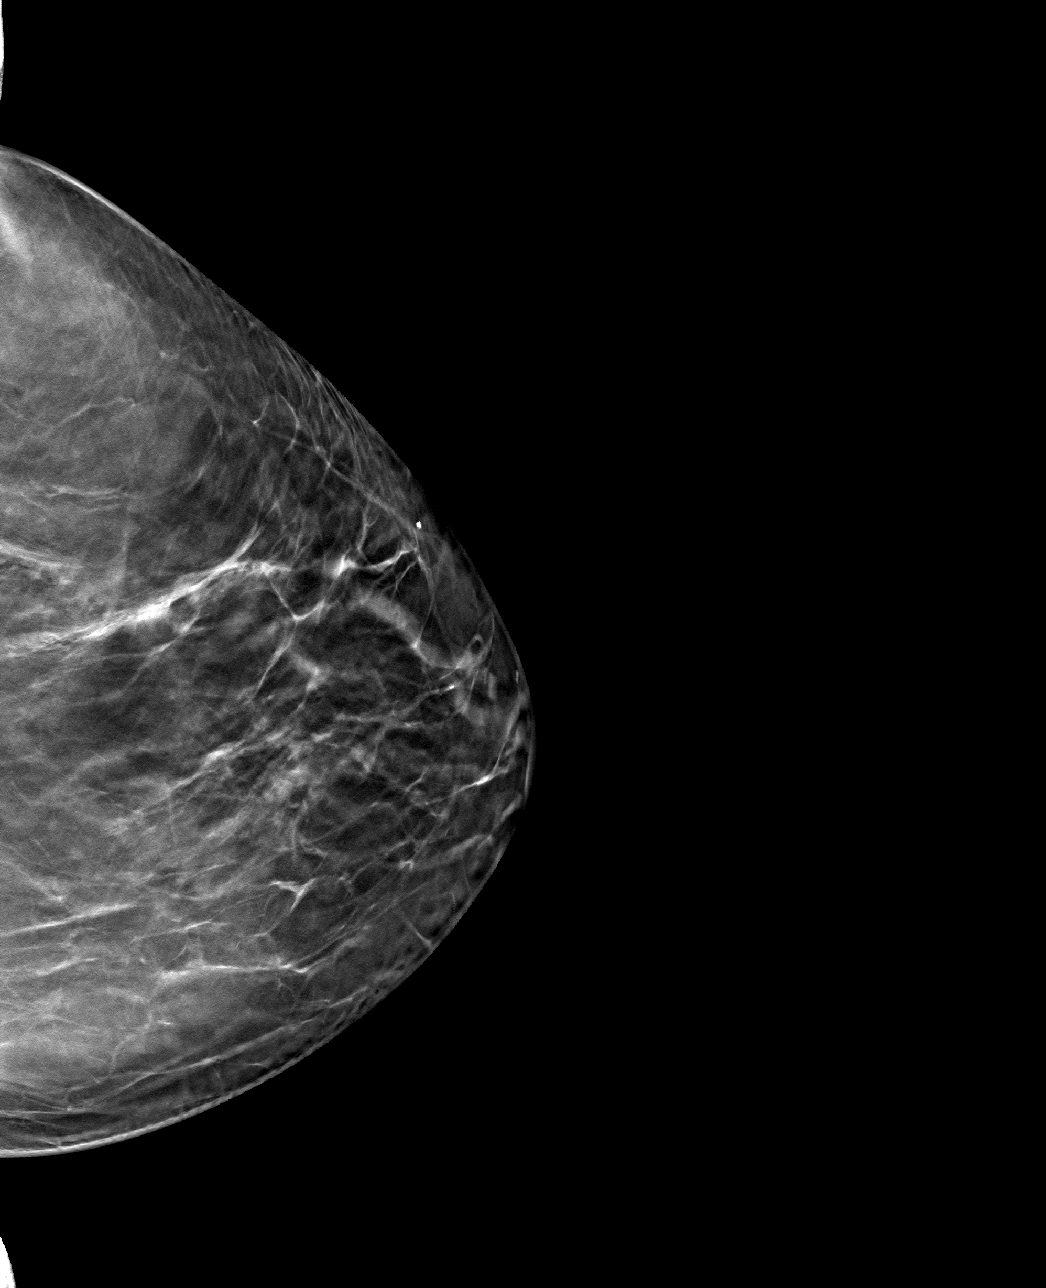

[4 of 12 positions shown; findings below may reference images not displayed]

FINDINGS: 3D Mammographic images were obtained following stereotactic guided
biopsy of the OUTER LEFT breast asymmetry. The X biopsy marking clip
is in expected position at the site of biopsy.
IMPRESSION: Appropriate positioning of the X shaped biopsy marking clip at the
site of biopsy in the OUTER LEFT breast.

Final Assessment: Post Procedure Mammograms for Marker Placement

## 2022-07-16 ENCOUNTER — Other Ambulatory Visit
Admission: RE | Admit: 2022-07-16 | Discharge: 2022-07-16 | Disposition: A | Discharge status: Home / Self Care | Payer: 59 | Source: Ambulatory Visit | Attending: Student | Admitting: Student

## 2022-07-16 DIAGNOSIS — R0781 Pleurodynia: Secondary | ICD-10-CM | POA: Diagnosis present

## 2022-07-16 LAB — D-DIMER, QUANTITATIVE: D-Dimer, Quant: 0.51 ug/mL-FEU — ABNORMAL HIGH (ref 0.00–0.50)

## 2022-07-24 ENCOUNTER — Other Ambulatory Visit: Payer: Self-pay | Admitting: Family Medicine

## 2022-07-24 DIAGNOSIS — M5416 Radiculopathy, lumbar region: Secondary | ICD-10-CM

## 2022-08-07 ENCOUNTER — Ambulatory Visit
Admission: RE | Admit: 2022-08-07 | Discharge: 2022-08-07 | Disposition: A | Discharge status: Home / Self Care | Payer: Managed Care, Other (non HMO) | Source: Ambulatory Visit | Attending: Family Medicine | Admitting: Family Medicine

## 2022-08-07 DIAGNOSIS — M5416 Radiculopathy, lumbar region: Secondary | ICD-10-CM

## 2023-07-22 ENCOUNTER — Ambulatory Visit
Admission: RE | Admit: 2023-07-22 | Discharge: 2023-07-22 | Disposition: A | Discharge status: Home / Self Care | Payer: Managed Care, Other (non HMO) | Attending: Gastroenterology | Admitting: Gastroenterology

## 2023-07-22 ENCOUNTER — Ambulatory Visit: Admitting: Certified Registered Nurse Anesthetist

## 2023-07-22 ENCOUNTER — Encounter
Admission: RE | Disposition: A | Discharge status: Home / Self Care | Payer: Self-pay | Source: Home / Self Care | Attending: Gastroenterology

## 2023-07-22 ENCOUNTER — Other Ambulatory Visit: Payer: Self-pay

## 2023-07-22 ENCOUNTER — Encounter: Payer: Self-pay | Admitting: Gastroenterology

## 2023-07-22 DIAGNOSIS — K529 Noninfective gastroenteritis and colitis, unspecified: Secondary | ICD-10-CM | POA: Diagnosis present

## 2023-07-22 DIAGNOSIS — D123 Benign neoplasm of transverse colon: Secondary | ICD-10-CM | POA: Diagnosis not present

## 2023-07-22 DIAGNOSIS — D124 Benign neoplasm of descending colon: Secondary | ICD-10-CM | POA: Diagnosis not present

## 2023-07-22 DIAGNOSIS — Z980 Intestinal bypass and anastomosis status: Secondary | ICD-10-CM | POA: Diagnosis not present

## 2023-07-22 DIAGNOSIS — Z9049 Acquired absence of other specified parts of digestive tract: Secondary | ICD-10-CM | POA: Insufficient documentation

## 2023-07-22 DIAGNOSIS — K640 First degree hemorrhoids: Secondary | ICD-10-CM | POA: Diagnosis not present

## 2023-07-22 DIAGNOSIS — K5730 Diverticulosis of large intestine without perforation or abscess without bleeding: Secondary | ICD-10-CM | POA: Insufficient documentation

## 2023-07-22 DIAGNOSIS — D125 Benign neoplasm of sigmoid colon: Secondary | ICD-10-CM | POA: Diagnosis not present

## 2023-07-22 DIAGNOSIS — D128 Benign neoplasm of rectum: Secondary | ICD-10-CM | POA: Insufficient documentation

## 2023-07-22 HISTORY — PX: POLYPECTOMY: SHX5525

## 2023-07-22 HISTORY — PX: COLONOSCOPY WITH PROPOFOL: SHX5780

## 2023-07-22 SURGERY — COLONOSCOPY WITH PROPOFOL
Anesthesia: General

## 2023-07-22 MED ORDER — LIDOCAINE HCL (PF) 2 % IJ SOLN
INTRAMUSCULAR | Status: AC
  Filled 2023-07-22: qty 15

## 2023-07-22 MED ORDER — PROPOFOL 500 MG/50ML IV EMUL
INTRAVENOUS | Status: DC | PRN
  Administered 2023-07-22: 180 ug/kg/min via INTRAVENOUS
  Administered 2023-07-22: 80 mg via INTRAVENOUS

## 2023-07-22 MED ORDER — SODIUM CHLORIDE 0.9 % IV SOLN: INTRAVENOUS | Status: DC

## 2023-07-22 MED ORDER — LIDOCAINE HCL (CARDIAC) PF 100 MG/5ML IV SOSY
PREFILLED_SYRINGE | INTRAVENOUS | Status: DC | PRN
  Administered 2023-07-22: 50 mg via INTRAVENOUS

## 2023-07-22 MED ORDER — GLYCOPYRROLATE 0.2 MG/ML IJ SOLN
INTRAMUSCULAR | Status: AC
  Filled 2023-07-22: qty 2

## 2023-07-22 MED ORDER — SODIUM CHLORIDE 0.9 % IV SOLN
INTRAVENOUS | Status: DC | PRN
Start: 2023-07-22 — End: 2023-07-22

## 2023-07-22 MED ORDER — ONDANSETRON HCL 4 MG/2ML IJ SOLN
INTRAMUSCULAR | Status: AC
  Filled 2023-07-22: qty 2

## 2023-07-22 MED ORDER — PROPOFOL 1000 MG/100ML IV EMUL
INTRAVENOUS | Status: AC
  Filled 2023-07-22: qty 100

## 2023-07-22 NOTE — Anesthesia Postprocedure Evaluation (Signed)
 Anesthesia Post Note  Patient: ALMARIE KURDZIEL  Procedure(s) Performed: COLONOSCOPY WITH PROPOFOL POLYPECTOMY  Patient location during evaluation: Endoscopy Anesthesia Type: General Level of consciousness: awake and alert Pain management: pain level controlled Vital Signs Assessment: post-procedure vital signs reviewed and stable Respiratory status: spontaneous breathing, nonlabored ventilation, respiratory function stable and patient connected to nasal cannula oxygen Cardiovascular status: blood pressure returned to baseline and stable Postop Assessment: no apparent nausea or vomiting Anesthetic complications: no  No notable events documented.   Last Vitals:  Vitals:   07/22/23 0821 07/22/23 0912  BP: (!) 150/77 (!) 86/50  Pulse: 69 63  Resp: 14 19  Temp:  36.4 C  SpO2: 100% 95%    Last Pain:  Vitals:   07/22/23 0934  TempSrc:   PainSc: 0-No pain                 Stephanie Coup

## 2023-07-22 NOTE — Transfer of Care (Signed)
 Immediate Anesthesia Transfer of Care Note  Patient: Kellie [name]  Procedure(s) Performed: COLONOSCOPY WITH PROPOFOL POLYPECTOMY  Patient Location: PACU and Endoscopy Unit  Anesthesia Type:General  Level of Consciousness: drowsy and patient cooperative  Airway & Oxygen Therapy: Patient Spontanous Breathing  Post-op Assessment: Report given to RN and Post -op Vital signs reviewed and stable  Post vital signs: Reviewed and stable  Last Vitals:  Vitals Value Taken Time  BP 86/50 07/22/23 0914  Temp 36.4 C 07/22/23 0912  Pulse 62 07/22/23 0916  Resp 20 07/22/23 0916  SpO2 94 % 07/22/23 0916  Vitals shown include unfiled device data.  Last Pain:  Vitals:   07/22/23 0912  TempSrc: Temporal  PainSc: 0-No pain         Complications: No notable events documented.

## 2023-07-22 NOTE — H&P (Signed)
 Pre-Procedure H&P   Patient ID: Kellie [name] is a 63 y.o. female.  Gastroenterology Provider: Jaynie Collins, DO  Referring Provider: Tawni Pummel, PA PCP: Lynnea Ferrier, MD  Date: 07/22/2023  HPI Ms. Kellie [name] is a 63 y.o. female who presents today for Colonoscopy for Chronic diarrhea .  Patient status post right hemicolectomy  She has had increasingly loose stools with urgency 2-3 times a day Occasional nocturnal awakenings.  No melena or hematochezia Infectious workup negative  She has decreased her dairy intake as she has noted this to be a trigger 2015 colonoscopy normal aside from internal hemorrhoids  Status post C-section x 2 and appendectomy No family history of colon cancer or colon polyps  Hemoglobin 13.2 MCV 89 platelets 271,000 creatinine 1.2   Past Medical History:  Diagnosis Date   Gout    HLD (hyperlipidemia)    HTN (hypertension)    OSA (obstructive sleep apnea)     Past Surgical History:  Procedure Laterality Date   BREAST BIOPSY Left 06/04/2020   affirm bx, x marker, path pending   CESAREAN SECTION     EXTERNAL EAR SURGERY     multiple (external-not specified)    Family History No h/o GI disease or malignancy  Review of Systems  Constitutional:  Negative for activity change, appetite change, chills, diaphoresis, fatigue, fever and unexpected weight change.  HENT:  Negative for trouble swallowing and voice change.   Respiratory:  Negative for shortness of breath and wheezing.   Cardiovascular:  Negative for chest pain, palpitations and leg swelling.  Gastrointestinal:  Positive for diarrhea. Negative for abdominal distention, abdominal pain, anal bleeding, blood in stool, constipation, nausea, rectal pain and vomiting.  Musculoskeletal:  Negative for arthralgias and myalgias.  Skin:  Negative for color change and pallor.  Neurological:  Negative for dizziness, syncope and weakness.  Psychiatric/Behavioral:  Negative for  confusion.   All other systems reviewed and are negative.    Medications No current facility-administered medications on file prior to encounter.   Current Outpatient Medications on File Prior to Encounter  Medication Sig Dispense Refill   atenolol (TENORMIN) 50 MG tablet Take 50 mg by mouth daily.       calcium-vitamin D (OSCAL) 250-125 MG-UNIT per tablet Take 1 tablet by mouth 2 (two) times daily.       cetirizine (ZYRTEC) 10 MG tablet Take 10 mg by mouth as needed.       hydrochlorothiazide 25 MG tablet Take 25 mg by mouth daily.       colchicine 0.6 MG tablet Take 0.6 mg by mouth daily as needed.       fluticasone (FLONASE) 50 MCG/ACT nasal spray Place 2 sprays into the nose daily as needed.       potassium chloride (MICRO-K) 10 MEQ CR capsule Take 10 mEq by mouth daily.       simvastatin (ZOCOR) 20 MG tablet Take 20 mg by mouth at bedtime.       traZODone (DESYREL) 50 MG tablet Take 50 mg by mouth at bedtime. prn       Pertinent medications related to GI and procedure were reviewed by me with the patient prior to the procedure   Current Facility-Administered Medications:    0.9 %  sodium chloride infusion, , Intravenous, Continuous, Jaynie Collins, DO, Last Rate: 20 mL/hr at 07/22/23 0828, New Bag at 07/22/23 0828  sodium chloride 20 mL/hr at 07/22/23 [phone]  Allergies  Allergen Reactions   Levofloxacin    Niacin    Allergies were reviewed by me prior to the procedure  Objective   Body mass index is 32.59 kg/m. Vitals:   07/22/23 0821  BP: (!) 150/77  Pulse: 69  Resp: 14  TempSrc: Temporal  SpO2: 100%  Weight: 83.5 kg  Height: 5\' 3"  (1.6 m)     Physical Exam Vitals and nursing note reviewed.  Constitutional:      General: She is not in acute distress.    Appearance: Normal appearance. She is not ill-appearing, toxic-appearing or diaphoretic.  HENT:     Head: Normocephalic and atraumatic.     Nose: Nose normal.     Mouth/Throat:     Mouth:  Mucous membranes are moist.     Pharynx: Oropharynx is clear.  Eyes:     General: No scleral icterus.    Extraocular Movements: Extraocular movements intact.  Cardiovascular:     Rate and Rhythm: Normal rate and regular rhythm.     Heart sounds: Normal heart sounds. No murmur heard.    No friction rub. No gallop.  Pulmonary:     Effort: Pulmonary effort is normal. No respiratory distress.     Breath sounds: Normal breath sounds. No wheezing, rhonchi or rales.  Abdominal:     General: Bowel sounds are normal. There is no distension.     Palpations: Abdomen is soft.     Tenderness: There is no abdominal tenderness. There is no guarding or rebound.  Musculoskeletal:     Cervical back: Neck supple.     Right lower leg: No edema.     Left lower leg: No edema.  Skin:    General: Skin is warm and dry.     Coloration: Skin is not jaundiced or pale.  Neurological:     General: No focal deficit present.     Mental Status: She is alert and oriented to person, place, and time. Mental status is at baseline.  Psychiatric:        Mood and Affect: Mood normal.        Behavior: Behavior normal.        Thought Content: Thought content normal.        Judgment: Judgment normal.      Assessment:  Ms. Kellie [name] is a 63 y.o. female  who presents today for Colonoscopy for Chronic diarrhea .  Plan:  Colonoscopy with possible intervention today  Colonoscopy with possible biopsy, control of bleeding, polypectomy, and interventions as necessary has been discussed with the patient/patient representative. Informed consent was obtained from the patient/patient representative after explaining the indication, nature, and risks of the procedure including but not limited to death, bleeding, perforation, missed neoplasm/lesions, cardiorespiratory compromise, and reaction to medications. Opportunity for questions was given and appropriate answers were provided. Patient/patient representative has verbalized  understanding is amenable to undergoing the procedure.   Jaynie Collins, DO  [facility]  Portions of the record may have been created with voice recognition software. Occasional wrong-word or 'sound-a-like' substitutions may have occurred due to the inherent limitations of voice recognition software.  Read the chart carefully and recognize, using context, where substitutions may have occurred.

## 2023-07-22 NOTE — Anesthesia Preprocedure Evaluation (Signed)
 Anesthesia Evaluation  Patient identified by MRN, date of birth, ID band Patient awake    Reviewed: Allergy & Precautions, NPO status , Patient's Chart, lab work & pertinent test results  Airway Mallampati: III  TM Distance: >3 FB Neck ROM: full    Dental  (+) Chipped   Pulmonary neg pulmonary ROS   Pulmonary exam normal        Cardiovascular hypertension, On Medications negative cardio ROS Normal cardiovascular exam     Neuro/Psych negative neurological ROS  negative psych ROS   GI/Hepatic negative GI ROS, Neg liver ROS,,,  Endo/Other  negative endocrine ROS    Renal/GU negative Renal ROS  negative genitourinary   Musculoskeletal   Abdominal   Peds  Hematology negative hematology ROS (+)   Anesthesia Other Findings Past Medical History: No date: Gout No date: HLD (hyperlipidemia) No date: HTN (hypertension) No date: OSA (obstructive sleep apnea)  Past Surgical History: 06/04/2020: BREAST BIOPSY; Left     Comment:  affirm bx, x marker, path pending No date: CESAREAN SECTION No date: EXTERNAL EAR SURGERY     Comment:  multiple (external-not specified)  BMI    Body Mass Index: 32.59 kg/m      Reproductive/Obstetrics negative OB ROS                             Anesthesia Physical Anesthesia Plan  ASA: 2  Anesthesia Plan: General   Post-op Pain Management: Minimal or no pain anticipated   Induction: Intravenous  PONV Risk Score and Plan: 3 and Propofol infusion, TIVA and Ondansetron  Airway Management Planned: Nasal Cannula  Additional Equipment: None  Intra-op Plan:   Post-operative Plan:   Informed Consent: I have reviewed the patients History and Physical, chart, labs and discussed the procedure including the risks, benefits and alternatives for the proposed anesthesia with the patient or authorized representative who has indicated his/her understanding and  acceptance.     Dental advisory given  Plan Discussed with: CRNA and Surgeon  Anesthesia Plan Comments: (Discussed risks of anesthesia with patient, including possibility of difficulty with spontaneous ventilation under anesthesia necessitating airway intervention, PONV, and rare risks such as cardiac or respiratory or neurological events, and allergic reactions. Discussed the role of CRNA in patient's perioperative care. Patient understands.)       Anesthesia Quick Evaluation

## 2023-07-22 NOTE — Op Note (Signed)
 [facility] Patient Name: Jozette Castrellon Procedure Date: 07/22/2023 8:31 AM MRN: [id number] Account #: [id number] Date of Birth: January 29, 1961 Admit Type: Outpatient Age: 63 Room: [facility] ENDO ROOM 1 Gender: Female Note Status: Finalized Instrument Name: Colonoscope [id number] Procedure:             Colonoscopy Indications:           Chronic diarrhea Providers:             Jaynie Collins DO, DO Medicines:             Monitored Anesthesia Care Complications:         No immediate complications. Estimated blood loss:                         Minimal. Procedure:             Pre-Anesthesia Assessment:                        - Prior to the procedure, a History and Physical was                         performed, and patient medications and allergies were                         reviewed. The patient is competent. The risks and                         benefits of the procedure and the sedation options and                         risks were discussed with the patient. All questions                         were answered and informed consent was obtained.                         Patient identification and proposed procedure were                         verified by the physician, the nurse, the anesthetist                         and the technician in the endoscopy suite. Mental                         Status Examination: alert and oriented. Airway                         Examination: normal oropharyngeal airway and neck                         mobility. Respiratory Examination: clear to                         auscultation. CV Examination: RRR, no murmurs, no S3                         or S4. Prophylactic Antibiotics: The patient does not  require prophylactic antibiotics. Prior                         Anticoagulants: The patient has taken no anticoagulant                         or antiplatelet agents. ASA Grade Assessment: II - A                          patient with mild systemic disease. After reviewing                         the risks and benefits, the patient was deemed in                         satisfactory condition to undergo the procedure. The                         anesthesia plan was to use monitored anesthesia care                         (MAC). Immediately prior to administration of                         medications, the patient was re-assessed for adequacy                         to receive sedatives. The heart rate, respiratory                         rate, oxygen saturations, blood pressure, adequacy of                         pulmonary ventilation, and response to care were                         monitored throughout the procedure. The physical                         status of the patient was re-assessed after the                         procedure.                        After obtaining informed consent, the colonoscope was                         passed under direct vision. Throughout the procedure,                         the patient's blood pressure, pulse, and oxygen                         saturations were monitored continuously. The                         Colonoscope was introduced through the anus and  advanced to the the ileocolonic anastomosis. The                         colonoscopy was performed without difficulty. The                         patient tolerated the procedure well. The quality of                         the bowel preparation was adequate. The rectum,                         iliocolonic anastamosis were photographed. Findings:      The perianal and digital rectal examinations were normal. Pertinent       negatives include normal sphincter tone.      The neo-terminal ileum appeared normal. Biopsies were taken with a cold       forceps for histology. Estimated blood loss was minimal.      There was evidence of a prior end-to-side ileo-colonic anastomosis  in       the transverse colon. This was patent and was characterized by healthy       appearing mucosa. The anastomosis was traversed. Estimated blood loss:       none.      Four sessile polyps were found in the rectum, sigmoid colon, descending       colon and transverse colon. The polyps were 1 to 2 mm in size. These       polyps were removed with a jumbo cold forceps. Resection and retrieval       were complete. Estimated blood loss: none. Estimated blood loss was       minimal.      Multiple small-mouthed diverticula were found in the sigmoid colon.       Estimated blood loss: none.      Non-bleeding internal hemorrhoids were found during retroflexion. The       hemorrhoids were Grade I (internal hemorrhoids that do not prolapse).       Estimated blood loss: none.      Normal mucosa was found in the entire colon. Biopsies for histology were       taken with a cold forceps from the left colon and transverse colon for       evaluation of microscopic colitis. Estimated blood loss was minimal.      The exam was otherwise without abnormality on direct and retroflexion       views. Impression:            - The examined portion of the ileum was normal.                         Biopsied.                        - Patent end-to-side ileo-colonic anastomosis,                         characterized by healthy appearing mucosa.                        - Four 1 to 2 mm polyps in the rectum, in the sigmoid  colon, in the descending colon and in the transverse                         colon, removed with a jumbo cold forceps. Resected and                         retrieved.                        - Diverticulosis in the sigmoid colon.                        - Non-bleeding internal hemorrhoids.                        - Normal mucosa in the entire examined colon. Biopsied.                        - The examination was otherwise normal on direct and                          retroflexion views. Recommendation:        - Patient has a contact number available for                         emergencies. The signs and symptoms of potential                         delayed complications were discussed with the patient.                         Return to normal activities tomorrow. Written                         discharge instructions were provided to the patient.                        - Discharge patient to home.                        - Resume previous diet.                        - Continue present medications.                        - Await pathology results.                        - Repeat colonoscopy for surveillance based on                         pathology results.                        - Recommend additional fiber supplementation- ie                         metamucil, benefiber, citrucel                        Pending biopsy results,  bile acid sequestrant may be                         an option as well                        - Return to GI clinic as previously scheduled.                        - The findings and recommendations were discussed with                         the patient. Procedure Code(s):     --- Professional ---                        [phone], Colonoscopy, flexible; with biopsy, single or                         multiple Diagnosis Code(s):     --- Professional ---                        K64.0, First degree hemorrhoids                        Z98.0, Intestinal bypass and anastomosis status                        D12.8, Benign neoplasm of rectum                        D12.5, Benign neoplasm of sigmoid colon                        D12.4, Benign neoplasm of descending colon                        D12.3, Benign neoplasm of transverse colon (hepatic                         flexure or splenic flexure)                        K52.9, Noninfective gastroenteritis and colitis,                         unspecified                        K57.30,  Diverticulosis of large intestine without                         perforation or abscess without bleeding CPT copyright 2022 American Medical Association. All rights reserved. The codes documented in this report are preliminary and upon coder review may  be revised to meet current compliance requirements. Attending Participation:      I personally performed the entire procedure. Elfredia Nevins, DO Jaynie Collins DO, DO 07/22/2023 9:20:20 AM This report has been signed electronically. Number of Addenda: 0 Note Initiated On: 07/22/2023 8:31 AM Scope Withdrawal Time: 0 hours 13 minutes 46 seconds  Total Procedure Duration: 0 hours 18 minutes 16 seconds  Estimated Blood Loss:  Estimated blood loss was minimal.      [facility]

## 2023-07-22 NOTE — Interval H&P Note (Signed)
 History and Physical Interval Note: Preprocedure H&P from 07/22/23  was reviewed and there was no interval change after seeing and examining the patient.  Written consent was obtained from the patient after discussion of risks, benefits, and alternatives. Patient has consented to proceed with Colonoscopy with possible intervention   07/22/2023 8:41 AM  [name]  has presented today for surgery, with the diagnosis of R19.7 (ICD-10-CM) - Diarrhea, unspecified type.  The various methods of treatment have been discussed with the patient and family. After consideration of risks, benefits and other options for treatment, the patient has consented to  Procedure(s): COLONOSCOPY WITH PROPOFOL (N/A) as a surgical intervention.  The patient's history has been reviewed, patient examined, no change in status, stable for surgery.  I have reviewed the patient's chart and labs.  Questions were answered to the patient's satisfaction.     Kellie [name]

## 2023-07-23 LAB — SURGICAL PATHOLOGY

## 2024-01-01 ENCOUNTER — Emergency Department

## 2024-01-01 ENCOUNTER — Encounter: Payer: Self-pay | Admitting: Emergency Medicine

## 2024-01-01 ENCOUNTER — Emergency Department
Admission: EM | Admit: 2024-01-01 | Discharge: 2024-01-01 | Disposition: A | Discharge status: Home / Self Care | Attending: Emergency Medicine | Admitting: Emergency Medicine

## 2024-01-01 ENCOUNTER — Other Ambulatory Visit: Payer: Self-pay

## 2024-01-01 DIAGNOSIS — W19XXXA Unspecified fall, initial encounter: Secondary | ICD-10-CM

## 2024-01-01 DIAGNOSIS — M542 Cervicalgia: Secondary | ICD-10-CM | POA: Insufficient documentation

## 2024-01-01 DIAGNOSIS — M545 Low back pain, unspecified: Secondary | ICD-10-CM | POA: Insufficient documentation

## 2024-01-01 DIAGNOSIS — S0081XA Abrasion of other part of head, initial encounter: Secondary | ICD-10-CM | POA: Diagnosis not present

## 2024-01-01 DIAGNOSIS — S0003XA Contusion of scalp, initial encounter: Secondary | ICD-10-CM | POA: Insufficient documentation

## 2024-01-01 DIAGNOSIS — I10 Essential (primary) hypertension: Secondary | ICD-10-CM | POA: Diagnosis not present

## 2024-01-01 DIAGNOSIS — S0990XA Unspecified injury of head, initial encounter: Secondary | ICD-10-CM | POA: Diagnosis present

## 2024-01-01 DIAGNOSIS — W108XXA Fall (on) (from) other stairs and steps, initial encounter: Secondary | ICD-10-CM | POA: Insufficient documentation

## 2024-01-01 MED ORDER — HYDROCODONE-ACETAMINOPHEN 5-325 MG PO TABS
1.0000 | ORAL_TABLET | Freq: Once | ORAL | Status: AC
  Administered 2024-01-01: 1 via ORAL
  Filled 2024-01-01: qty 1

## 2024-01-01 MED ORDER — BACITRACIN 500 UNIT/GM EX OINT: 1.0000 | TOPICAL_OINTMENT | Freq: Two times a day (BID) | CUTANEOUS | 0 refills | Status: AC

## 2024-01-01 MED ORDER — BACITRACIN ZINC 500 UNIT/GM EX OINT
TOPICAL_OINTMENT | Freq: Two times a day (BID) | CUTANEOUS | Status: DC
  Administered 2024-01-01: 1 via TOPICAL
  Filled 2024-01-01: qty 0.9

## 2024-01-01 NOTE — ED Triage Notes (Signed)
 Patient reports falling down a full flight of steps this evening, hitting her head.  Patient denies being on blood thinners.  Patient unsure if she had LOC.  Patient has abrasions and bruising to left side of face and c/o neck pain and left side lower back pain.

## 2024-01-01 NOTE — ED Provider Notes (Signed)
 [facility] Provider Note    Event Date/Time   First MD Initiated Contact with Patient 01/01/24 2056     (approximate)   History   Fall   HPI  Kellie [name] is a 63 y.o. female with PMH of hypertension who presents for evaluation after a fall.  Patient states she lost her balance and fell down a full flight of steps this evening hitting her head multiple times.  She sustained some abrasions and carpet burn to her face.  She reports some neck pain, lower back pain and pain to her face.  Patient not sure if she lost consciousness.  She states she did not feel dizzy or lightheaded before her fall.      Physical Exam   Triage Vital Signs: ED Triage Vitals  Encounter Vitals Group     BP 01/01/24 1953 (!) 170/77     Girls Systolic BP Percentile --      Girls Diastolic BP Percentile --      Boys Systolic BP Percentile --      Boys Diastolic BP Percentile --      Pulse Rate 01/01/24 1953 68     Resp 01/01/24 1953 18     Temp 01/01/24 1953 (!) 97.5 F (36.4 C)     Temp Source 01/01/24 1953 Oral     SpO2 01/01/24 1953 95 %     Weight 01/01/24 1954 185 lb (83.9 kg)     Height --      Head Circumference --      Peak Flow --      Pain Score 01/01/24 1954 8     Pain Loc --      Pain Education --      Exclude from Growth Chart --     Most recent vital signs: Vitals:   01/01/24 1953  BP: (!) 170/77  Pulse: 68  Resp: 18  Temp: (!) 97.5 F (36.4 C)  SpO2: 95%   General: Awake, no distress.  CV:  Good peripheral perfusion.  RRR, systolic murmur heard. Resp:  Normal effort.  CTAB Abd:  No distention.  Other:  Abrasions to the face, no pain over the jaw or TMJ, no tenderness to palpation over the spine, neck range of motion well-maintained but does elicit some pain.  No focal neurodeficits, no ataxia, no pronator drift.  PERRL, EOM intact.   ED Results / Procedures / Treatments   Labs (all labs ordered are listed, but only abnormal results are  displayed) Labs Reviewed - No data to display  RADIOLOGY  CT head, cervical spine and lumbar spine x-ray obtained, interpreted the images as well as reviewed the radiologist report.  No acute abnormalities.  CT head IMPRESSION:  1. No acute intracranial abnormality. No skull fracture.  2. Left frontal scalp hematoma.  3. Chronic appearing erosion involving the right inferior nasal passage  extending to the maxilla.   CT cervical spine IMPRESSION:  1. No acute abnormality of the cervical spine related to the reported trauma.  2. C5-C6 degenerative disc disease with posterior spurring causing mass effect  on the spinal canal.   Lumbar x-ray IMPRESSION:  1. No acute fracture.  2. Scoliosis with mild degenerative disc disease and moderate facet hypertrophy.   PROCEDURES:  Critical Care performed: No  Procedures   MEDICATIONS ORDERED IN ED: Medications  HYDROcodone -acetaminophen  (NORCO/VICODIN) 5-325 MG per tablet 1 tablet (has no administration in time range)     IMPRESSION / MDM /  ASSESSMENT AND PLAN / ED COURSE  I reviewed the triage vital signs and the nursing notes.                             63 year old female presents for evaluation after a fall down the stairs today.  Blood pressure is significantly elevated but patient is quite uncomfortable and does have history of hypertension.  Vital signs stable otherwise.  Differential diagnosis includes, but is not limited to, abrasion, contusion, closed head injury, concussion, intracranial bleed, neck fracture, lumbar spine fracture, muscle strain.  Patient's presentation is most consistent with acute complicated illness / injury requiring diagnostic workup.  CT head, neck and lumbar spine x-ray are all negative for acute abnormalities.  Patient does have some abrasions to her face which we will clean, apply topical antibiotic ointment and bandage for her.  No lacerations.  No indication for wound closure.  Discussed  wound care for home.  Physical exam is reassuring.  No focal neurodeficits.  Patient reports minimal symptoms of concussion including a headache.  Did discuss other symptoms to look out for.  Reviewed when to follow-up.  Patient was given pain medication while in the emergency department.  Advised Tylenol , ibuprofen and topical pain relievers at home.    Patient voiced understanding, all questions were answered she stable at discharge.     FINAL CLINICAL IMPRESSION(S) / ED DIAGNOSES   Final diagnoses:  Fall, initial encounter     Rx / DC Orders   ED Discharge Orders     None        Note:  This document was prepared using Dragon voice recognition software and may include unintentional dictation errors.   Cleaster Tinnie LABOR, PA-C 01/01/24 2144    Willo Dunnings, MD 01/01/24 [phone]

## 2024-01-01 NOTE — Discharge Instructions (Signed)
 The CT scan of your head and neck did not show any acute abnormalities.  The lumbar spine x-ray was also negative for acute abnormalities.  Results from your imaging can be seen below.  For the abrasions on your face, please wash with soap and water, pat dry and apply triple antibiotic ointment.  You can bandage them if you would like.  You may have a concussion.  Symptoms of a concussion include headache, blurry vision, nausea, vomiting, dizziness, sensitivity to light, sensitivity to sound, difficulty sleeping, difficulty concentrating, mood swings.  You may notice that you have more symptoms that develop over time, this is normal.  If your symptoms persist for longer than 2 weeks please be reevaluated by another healthcare provider.  If you have any worsening symptoms or ones that are personally concerning to you please return to the emergency department.  You can take 650 mg of Tylenol  and 600 mg of ibuprofen every 6 hours as needed for pain. You can use ice, heat, muscle creams and other topical pain relievers as well.  CT head IMPRESSION:  1. No acute intracranial abnormality. No skull fracture.  2. Left frontal scalp hematoma.  3. Chronic appearing erosion involving the right inferior nasal passage  extending to the maxilla.   CT cervical spine IMPRESSION:  1. No acute abnormality of the cervical spine related to the reported trauma.  2. C5-C6 degenerative disc disease with posterior spurring causing mass effect  on the spinal canal.   Lumbar x-ray IMPRESSION:  1. No acute fracture.  2. Scoliosis with mild degenerative disc disease and moderate facet hypertrophy.
